# Patient Record
Sex: Female | Born: 1975 | Race: Black or African American | Hispanic: No | Marital: Single | Smoking: Never smoker
Health system: Southern US, Community
[De-identification: ages and names within clinical notes are randomized; demographics above are authoritative.]

## PROBLEM LIST (undated history)

## (undated) DIAGNOSIS — I10 Essential (primary) hypertension: Secondary | ICD-10-CM

## (undated) DIAGNOSIS — I480 Paroxysmal atrial fibrillation: Secondary | ICD-10-CM

## (undated) HISTORY — DX: Morbid (severe) obesity due to excess calories: E66.01

## (undated) HISTORY — PX: TUBAL LIGATION: SHX77

## (undated) HISTORY — DX: Paroxysmal atrial fibrillation: I48.0

## (undated) HISTORY — PX: CHOLECYSTECTOMY: SHX55

## (undated) HISTORY — PX: GASTRIC BYPASS: SHX52

---

## 2004-10-01 ENCOUNTER — Ambulatory Visit: Payer: Self-pay | Admitting: Obstetrics and Gynecology

## 2013-10-01 ENCOUNTER — Ambulatory Visit: Payer: Self-pay | Admitting: Bariatrics

## 2013-10-01 LAB — COMPREHENSIVE METABOLIC PANEL
Alkaline Phosphatase: 129 U/L (ref 50–136)
Anion Gap: 6 — ABNORMAL LOW (ref 7–16)
Calcium, Total: 8.9 mg/dL (ref 8.5–10.1)
EGFR (African American): >60
EGFR (Non-African Amer.): >60
Glucose: 96 mg/dL (ref 65–99)
Osmolality: 275 (ref 275–301)
SGOT(AST): 21 U/L (ref 15–37)
Sodium: 138 mmol/L (ref 136–145)
Total Protein: 7.8 g/dL (ref 6.4–8.2)

## 2013-10-01 LAB — APTT: Activated PTT: 30.9 secs (ref 23.6–35.9)

## 2013-10-01 LAB — PROTIME-INR
INR: 1
Prothrombin Time: 13.6 secs (ref 11.5–14.7)

## 2013-10-01 LAB — CBC WITH DIFFERENTIAL/PLATELET
Eosinophil #: 0 10*3/uL (ref 0.0–0.7)
Eosinophil %: 0.5 %
HGB: 12.6 g/dL (ref 12.0–16.0)
Lymphocyte %: 32.1 %
MCH: 27.6 pg (ref 26.0–34.0)
MCV: 82 fL (ref 80–100)
Monocyte #: 0.5 x10 3/mm (ref 0.2–0.9)
Monocyte %: 4.9 %
Neutrophil #: 5.7 10*3/uL (ref 1.4–6.5)
Neutrophil %: 61.3 %
RBC: 4.58 10*6/uL (ref 3.80–5.20)
WBC: 9.3 10*3/uL (ref 3.6–11.0)

## 2013-10-01 LAB — IRON AND TIBC
Iron Saturation: 20 %
Iron: 66 ug/dL (ref 50–170)

## 2013-10-01 LAB — MAGNESIUM: Magnesium: 1.5 mg/dL — ABNORMAL LOW

## 2013-10-01 LAB — HEMOGLOBIN A1C: Hemoglobin A1C: 6.3 % (ref 4.2–6.3)

## 2013-10-01 LAB — PHOSPHORUS: Phosphorus: 2.7 mg/dL (ref 2.5–4.9)

## 2013-10-01 LAB — BILIRUBIN, DIRECT: Bilirubin, Direct: 0.1 mg/dL (ref 0.00–0.20)

## 2013-10-01 LAB — FOLATE: Folic Acid: 17.6 ng/mL (ref 3.1–100.0)

## 2013-10-01 LAB — FERRITIN: Ferritin (ARMC): 28 ng/mL (ref 8–388)

## 2013-10-01 LAB — AMYLASE: Amylase: 79 U/L (ref 25–115)

## 2013-11-03 ENCOUNTER — Ambulatory Visit: Payer: Self-pay | Admitting: Bariatrics

## 2013-11-08 ENCOUNTER — Ambulatory Visit: Payer: Self-pay | Admitting: Bariatrics

## 2014-01-31 ENCOUNTER — Other Ambulatory Visit: Payer: Self-pay | Admitting: Bariatrics

## 2014-01-31 LAB — COMPREHENSIVE METABOLIC PANEL
ALBUMIN: 4 g/dL (ref 3.4–5.0)
ANION GAP: 10 (ref 7–16)
AST: 21 U/L (ref 15–37)
Alkaline Phosphatase: 84 U/L
BUN: 15 mg/dL (ref 7–18)
Bilirubin,Total: 0.5 mg/dL (ref 0.2–1.0)
CO2: 20 mmol/L — AB (ref 21–32)
Calcium, Total: 9.3 mg/dL (ref 8.5–10.1)
Chloride: 106 mmol/L (ref 98–107)
Creatinine: 0.78 mg/dL (ref 0.60–1.30)
EGFR (African American): >60
EGFR (Non-African Amer.): >60
Glucose: 74 mg/dL (ref 65–99)
Osmolality: 271 (ref 275–301)
Potassium: 3.5 mmol/L (ref 3.5–5.1)
SGPT (ALT): 28 U/L (ref 12–78)
SODIUM: 136 mmol/L (ref 136–145)
TOTAL PROTEIN: 8.1 g/dL (ref 6.4–8.2)

## 2015-08-09 ENCOUNTER — Emergency Department
Admission: EM | Admit: 2015-08-09 | Discharge: 2015-08-09 | Disposition: A | Discharge status: Other Acute Inpatient Hospital | Payer: BLUE CROSS/BLUE SHIELD | Attending: Emergency Medicine | Admitting: Emergency Medicine

## 2015-08-09 ENCOUNTER — Emergency Department: Payer: BLUE CROSS/BLUE SHIELD

## 2015-08-09 ENCOUNTER — Encounter: Payer: Self-pay | Admitting: Emergency Medicine

## 2015-08-09 DIAGNOSIS — Z3202 Encounter for pregnancy test, result negative: Secondary | ICD-10-CM | POA: Diagnosis not present

## 2015-08-09 DIAGNOSIS — Z79899 Other long term (current) drug therapy: Secondary | ICD-10-CM | POA: Diagnosis not present

## 2015-08-09 DIAGNOSIS — R109 Unspecified abdominal pain: Secondary | ICD-10-CM

## 2015-08-09 DIAGNOSIS — Z9049 Acquired absence of other specified parts of digestive tract: Secondary | ICD-10-CM | POA: Insufficient documentation

## 2015-08-09 LAB — HEPATIC FUNCTION PANEL
ALT: 15 U/L (ref 14–54)
AST: 26 U/L (ref 15–41)
Albumin: 4.2 g/dL (ref 3.5–5.0)
Alkaline Phosphatase: 83 U/L (ref 38–126)
Bilirubin, Direct: <0.1 mg/dL — ABNORMAL LOW (ref 0.1–0.5)
Total Bilirubin: 0.6 mg/dL (ref 0.3–1.2)
Total Protein: 7.6 g/dL (ref 6.5–8.1)

## 2015-08-09 LAB — URINALYSIS COMPLETE WITH MICROSCOPIC (ARMC ONLY)
BILIRUBIN URINE: NEGATIVE
Glucose, UA: NEGATIVE mg/dL
NITRITE: NEGATIVE
Protein, ur: 30 mg/dL — AB
SPECIFIC GRAVITY, URINE: 1.023 (ref 1.005–1.030)
pH: 6 (ref 5.0–8.0)

## 2015-08-09 LAB — BASIC METABOLIC PANEL
Anion gap: 9 (ref 5–15)
BUN: 11 mg/dL (ref 6–20)
CO2: 24 mmol/L (ref 22–32)
Calcium: 9.2 mg/dL (ref 8.9–10.3)
Chloride: 103 mmol/L (ref 101–111)
Creatinine, Ser: 0.65 mg/dL (ref 0.44–1.00)
GFR calc Af Amer: >60 mL/min (ref >60)
GFR calc non Af Amer: >60 mL/min (ref >60)
Glucose, Bld: 148 mg/dL — ABNORMAL HIGH (ref 65–99)
Potassium: 3.7 mmol/L (ref 3.5–5.1)
Sodium: 136 mmol/L (ref 135–145)

## 2015-08-09 LAB — CBC
HCT: 36 % (ref 35.0–47.0)
HEMOGLOBIN: 11.9 g/dL — AB (ref 12.0–16.0)
MCH: 28.4 pg (ref 26.0–34.0)
MCHC: 33.1 g/dL (ref 32.0–36.0)
MCV: 85.8 fL (ref 80.0–100.0)
PLATELETS: 258 10*3/uL (ref 150–440)
RBC: 4.19 MIL/uL (ref 3.80–5.20)
RDW: 12.1 % (ref 11.5–14.5)
WBC: 12.8 10*3/uL — ABNORMAL HIGH (ref 3.6–11.0)

## 2015-08-09 LAB — PREGNANCY, URINE: Preg Test, Ur: NEGATIVE

## 2015-08-09 MED ORDER — HYDROMORPHONE HCL 1 MG/ML IJ SOLN: 0.5000 mg | INTRAMUSCULAR | Status: AC

## 2015-08-09 MED ORDER — HYDROMORPHONE HCL 1 MG/ML IJ SOLN
0.5000 mg | INTRAMUSCULAR | Status: AC
  Administered 2015-08-09: 0.5 mg via INTRAVENOUS
  Filled 2015-08-09: qty 1

## 2015-08-09 MED ORDER — HYDROMORPHONE HCL 1 MG/ML IJ SOLN
1.0000 mg | INTRAMUSCULAR | Status: AC
  Administered 2015-08-09: 1 mg via INTRAVENOUS
  Filled 2015-08-09: qty 1

## 2015-08-09 MED ORDER — ONDANSETRON HCL 4 MG/2ML IJ SOLN
4.0000 mg | Freq: Once | INTRAMUSCULAR | Status: AC
  Administered 2015-08-09: 4 mg via INTRAVENOUS

## 2015-08-09 MED ORDER — HYDROMORPHONE HCL 1 MG/ML IJ SOLN
INTRAMUSCULAR | Status: AC
  Administered 2015-08-09: 1 mg via INTRAVENOUS
  Filled 2015-08-09: qty 1

## 2015-08-09 MED ORDER — HYDROMORPHONE HCL 1 MG/ML IJ SOLN
INTRAMUSCULAR | Status: AC
  Filled 2015-08-09: qty 1

## 2015-08-09 MED ORDER — SODIUM CHLORIDE 0.9 % IV BOLUS (SEPSIS)
1000.0000 mL | Freq: Once | INTRAVENOUS | Status: AC
  Administered 2015-08-09: 1000 mL via INTRAVENOUS

## 2015-08-09 MED ORDER — IOHEXOL 240 MG/ML SOLN
25.0000 mL | Freq: Once | INTRAMUSCULAR | Status: DC | PRN
  Administered 2015-08-09: 25 mL via ORAL
  Filled 2015-08-09: qty 50

## 2015-08-09 MED ORDER — IOHEXOL 350 MG/ML SOLN
100.0000 mL | Freq: Once | INTRAVENOUS | Status: AC | PRN
  Administered 2015-08-09: 100 mL via INTRAVENOUS

## 2015-08-09 MED ORDER — MORPHINE SULFATE (PF) 4 MG/ML IV SOLN
INTRAVENOUS | Status: AC
  Administered 2015-08-09: 4 mg via INTRAVENOUS
  Filled 2015-08-09: qty 1

## 2015-08-09 MED ORDER — MORPHINE SULFATE (PF) 4 MG/ML IV SOLN
4.0000 mg | Freq: Once | INTRAVENOUS | Status: AC
  Administered 2015-08-09: 4 mg via INTRAVENOUS

## 2015-08-09 MED ORDER — HYDROMORPHONE HCL 1 MG/ML IJ SOLN
0.5000 mg | Freq: Once | INTRAMUSCULAR | Status: AC
  Administered 2015-08-09: 0.5 mg via INTRAVENOUS

## 2015-08-09 MED ORDER — HYDROMORPHONE HCL 1 MG/ML IJ SOLN
1.0000 mg | Freq: Once | INTRAMUSCULAR | Status: AC
  Administered 2015-08-09: 1 mg via INTRAVENOUS

## 2015-08-09 MED ORDER — METOCLOPRAMIDE HCL 5 MG/ML IJ SOLN
10.0000 mg | Freq: Once | INTRAMUSCULAR | Status: AC
  Administered 2015-08-09: 10 mg via INTRAVENOUS
  Filled 2015-08-09: qty 2

## 2015-08-09 MED ORDER — ONDANSETRON HCL 4 MG/2ML IJ SOLN
INTRAMUSCULAR | Status: AC
  Administered 2015-08-09: 4 mg via INTRAVENOUS
  Filled 2015-08-09: qty 2

## 2015-08-09 NOTE — ED Notes (Signed)
Urine pregnancy test POCT done and resulted negative.

## 2015-08-09 NOTE — ED Notes (Signed)
Pt to triage via w/c, appears uncomfortable; pt reports since yesterday having left flank pain radiating around into abdomen accomp by N/V; denies hx of same

## 2015-08-09 NOTE — ED Notes (Signed)
Patient failed to get any relief from administered Morphine . Patient writhing in bed and reporting intense pain. MD made aware. VORB for Dilaudid  IVP. Orders to be entered and carried by this RN.

## 2015-08-09 NOTE — ED Provider Notes (Addendum)
[facility] Emergency Department Provider Note  ____________________________________________  Time seen: Approximately 7:18 AM  I have reviewed the triage vital signs and the nursing notes.   HISTORY  Chief Complaint Flank Pain    HPI Cindy Payne is a 39 y.o. female history of previous gastric bypass, also tubal ligation, patient presents today with 1 day of left flank pain that radiates across into her left mid and lower abdomen. She describes as a sharp feeling, associated with nausea and vomiting several times yesterday. No fevers or chills. She did notice her urine seems slightly different yesterday but can't describe why. Denies any burning with urination. No chest pain or trouble breathing.  Pertinent history includes previous gastric bypass approximately 2 years ago for which patient states she's had no complications.   History reviewed. No pertinent past medical history.  There are no active problems to display for this patient.   Past Surgical History  Procedure Laterality Date  . Tubal ligation    . Cholecystectomy    . Gastric bypass      Current Outpatient Rx  Name  Route  Sig  Dispense  Refill  . Multiple Vitamin (MULTIVITAMIN WITH MINERALS) TABS tablet   Oral   Take 1 tablet by mouth daily.           Allergies Septra  No family history on file.  Social History Social History  Substance Use Topics  . Smoking status: Never Smoker   . Smokeless tobacco: None  . Alcohol Use: No    Review of Systems Constitutional: No fever/chills Eyes: No visual changes. ENT: No sore throat. Cardiovascular: Denies chest pain. Respiratory: Denies shortness of breath. Gastrointestinal: See history of present illness No diarrhea.  No constipation. Genitourinary: Negative for dysuria. Musculoskeletal: Negative for back pain. Skin: Negative for rash. Neurological: Negative for headaches, focal weakness or numbness.  10-point ROS  otherwise negative.  ____________________________________________   PHYSICAL EXAM:  VITAL SIGNS: ED Triage Vitals  Enc Vitals Group     BP 08/09/15 0614 160/81 mmHg     Pulse Rate 08/09/15 0614 61     Resp 08/09/15 0614 20     Temp 08/09/15 0614 98.1 F (36.7 C)     Temp Source 08/09/15 0614 Oral     SpO2 08/09/15 0614 98 %     Weight 08/09/15 0614 195 lb (88.451 kg)     Height 08/09/15 0614 5\' 3"  (1.6 m)     Head Cir --      Peak Flow --      Pain Score 08/09/15 0615 10     Pain Loc --      Pain Edu? --      Excl. in GC? --     Constitutional: Alert and oriented. Well appearing and in no acute distress. Eyes: Conjunctivae are normal. PERRL. EOMI. Head: Atraumatic. Nose: No congestion/rhinnorhea. Mouth/Throat: Mucous membranes are moist.  Oropharynx non-erythematous. Neck: No stridor.   Cardiovascular: Normal rate, regular rhythm. Grossly normal heart sounds.  Good peripheral circulation. Respiratory: Normal respiratory effort.  No retractions. Lungs CTAB. Gastrointestinal: Soft and nontender except for moderate tenderness in the left flank without rebound or guarding. No distention. No abdominal bruits. Mild CVA tenderness on left, none on the right. Musculoskeletal: No lower extremity tenderness nor edema.  No joint effusions. Neurologic:  Normal speech and language. No gross focal neurologic deficits are appreciated. No gait instability. Skin:  Skin is warm, dry and intact. No rash noted. Psychiatric: Mood  and affect are normal. Speech and behavior are normal.  ____________________________________________   LABS (all labs ordered are listed, but only abnormal results are displayed)  Labs Reviewed  BASIC METABOLIC PANEL - Abnormal; Notable for the following:    Glucose, Bld 148 (*)    All other components within normal limits  CBC - Abnormal; Notable for the following:    WBC 12.8 (*)    Hemoglobin 11.9 (*)    All other components within normal limits   URINALYSIS COMPLETEWITH MICROSCOPIC ([facility] ONLY) - Abnormal; Notable for the following:    Color, Urine YELLOW (*)    APPearance HAZY (*)    Ketones, ur 1+ (*)    Hgb urine dipstick 1+ (*)    Protein, ur 30 (*)    Leukocytes, UA TRACE (*)    Bacteria, UA MANY (*)    Squamous Epithelial / LPF 0-5 (*)    All other components within normal limits  HEPATIC FUNCTION PANEL - Abnormal; Notable for the following:    Bilirubin, Direct <0.1 (*)    All other components within normal limits  PREGNANCY, URINE   ____________________________________________  EKG   ____________________________________________  RADIOLOGY   IMPRESSION: 1. 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 The stomach and small bowel are in the left lower chest and part of the stomach is dilated and fluid-filled. Oral contrast is getting from the stomach into the jejunum through the gastric bypass. The remainder the stomach is dilated and fluid-filled. I do not see any findings for ischemia but some type of volvulus is possible. 2. Mildly dilated fluid-filled duodenum. 3. Dilated esophagus possibly due to achalasia. ____________________________________________   PROCEDURES  Procedure(s) performed: None  Critical Care performed: No  ____________________________________________   INITIAL IMPRESSION / ASSESSMENT AND PLAN / ED COURSE  Pertinent labs & imaging results that were available during my care of the patient were reviewed by me and considered in my medical decision making (see chart for details).  Patient just 1 day of left flank pain. No systemic symptoms. He does have focal tenderness in the left flank and along the left CVA. Differential diagnosis would include kidney stones, urinary tract infection, nephrolithiasis, ovarian torsion or cyst, alternative considerations are less likely based on exam include gastric perforation, splenic infarction, or other acute  intra-abdominal etiology. No cardiopulmonary symptoms.  ----------------------------------------- 1:00 PM on 08/09/2015 -----------------------------------------  CT scan discussed with Dr. Alva Garnet at [facility]. He advises transfer, and is working to arrange. Dr. Effie Shy accepting physician to [facility] in [place]. Discussed CT findings and concerns with patient, she is agreeable and understanding of need for transfer to [facility] to see Dr. Alva Garnet.  EMTALA  Completed  ----------------------------------------- 3:35 PM on 08/09/2015 -----------------------------------------  Ready and available bed affirmed at [facility] in [place]. Reevaluated the patient's she still has ongoing pain, have ordered additional to light. She is awake and alert in no distress. We'll transfer to [facility] via ALS ambulance. ____________________________________________   FINAL CLINICAL IMPRESSION(S) / ED DIAGNOSES  Final diagnoses:  Abdominal pain, left lateral      Sharyn Creamer, MD 08/09/15 1535  Ongoing care and disposition assigned Dr. Lenard Lance as we await transfer to [facility].  Sharyn Creamer, MD 08/09/15 [phone]

## 2015-08-09 NOTE — ED Notes (Signed)
Patient to [facility] hospital via EMS. Given medication for pain upon departure. RN notified at [facility].

## 2015-08-10 LAB — POCT PREGNANCY, URINE: PREG TEST UR: NEGATIVE

## 2017-01-25 ENCOUNTER — Encounter: Payer: Self-pay | Admitting: *Deleted

## 2017-01-25 ENCOUNTER — Observation Stay
Admission: EM | Admit: 2017-01-25 | Discharge: 2017-01-26 | Disposition: A | Discharge status: Home / Self Care | Payer: BLUE CROSS/BLUE SHIELD | Attending: Internal Medicine | Admitting: Internal Medicine

## 2017-01-25 DIAGNOSIS — I4891 Unspecified atrial fibrillation: Secondary | ICD-10-CM

## 2017-01-25 DIAGNOSIS — Z9049 Acquired absence of other specified parts of digestive tract: Secondary | ICD-10-CM | POA: Diagnosis not present

## 2017-01-25 DIAGNOSIS — I10 Essential (primary) hypertension: Secondary | ICD-10-CM | POA: Insufficient documentation

## 2017-01-25 DIAGNOSIS — Z883 Allergy status to other anti-infective agents status: Secondary | ICD-10-CM | POA: Insufficient documentation

## 2017-01-25 DIAGNOSIS — I480 Paroxysmal atrial fibrillation: Principal | ICD-10-CM | POA: Insufficient documentation

## 2017-01-25 DIAGNOSIS — Z8249 Family history of ischemic heart disease and other diseases of the circulatory system: Secondary | ICD-10-CM | POA: Diagnosis not present

## 2017-01-25 DIAGNOSIS — Z9884 Bariatric surgery status: Secondary | ICD-10-CM | POA: Diagnosis not present

## 2017-01-25 DIAGNOSIS — Z9851 Tubal ligation status: Secondary | ICD-10-CM | POA: Diagnosis not present

## 2017-01-25 DIAGNOSIS — Z79899 Other long term (current) drug therapy: Secondary | ICD-10-CM | POA: Diagnosis not present

## 2017-01-25 DIAGNOSIS — R55 Syncope and collapse: Secondary | ICD-10-CM | POA: Diagnosis present

## 2017-01-25 HISTORY — DX: Essential (primary) hypertension: I10

## 2017-01-25 NOTE — ED Triage Notes (Signed)
Pt at a restaurant w/ witnessed syncopal episode. No significant prior hx of syncope. Pt denies CP and SOB at this time. Pt ws eating and had approximately 1 oz of liquor with dinner. Pt has hx of roux en y. Pt has hx of HTN. Pt presents in a-fib, new onset, and hypotension. Pt is A&O x 4.

## 2017-01-26 ENCOUNTER — Encounter: Payer: Self-pay | Admitting: Family Medicine

## 2017-01-26 ENCOUNTER — Observation Stay (HOSPITAL_BASED_OUTPATIENT_CLINIC_OR_DEPARTMENT_OTHER)
Admit: 2017-01-26 | Discharge: 2017-01-26 | Disposition: A | Discharge status: Home / Self Care | Payer: BLUE CROSS/BLUE SHIELD | Attending: Family Medicine | Admitting: Family Medicine

## 2017-01-26 ENCOUNTER — Inpatient Hospital Stay: Payer: BLUE CROSS/BLUE SHIELD

## 2017-01-26 ENCOUNTER — Emergency Department: Payer: BLUE CROSS/BLUE SHIELD

## 2017-01-26 DIAGNOSIS — I4891 Unspecified atrial fibrillation: Secondary | ICD-10-CM

## 2017-01-26 DIAGNOSIS — R55 Syncope and collapse: Secondary | ICD-10-CM | POA: Diagnosis not present

## 2017-01-26 DIAGNOSIS — I480 Paroxysmal atrial fibrillation: Secondary | ICD-10-CM | POA: Diagnosis not present

## 2017-01-26 DIAGNOSIS — I10 Essential (primary) hypertension: Secondary | ICD-10-CM

## 2017-01-26 LAB — URINALYSIS, ROUTINE W REFLEX MICROSCOPIC
Bilirubin Urine: NEGATIVE
GLUCOSE, UA: NEGATIVE mg/dL
Hgb urine dipstick: NEGATIVE
Ketones, ur: NEGATIVE mg/dL
LEUKOCYTES UA: NEGATIVE
Nitrite: NEGATIVE
PH: 7 (ref 5.0–8.0)
Protein, ur: NEGATIVE mg/dL
Specific Gravity, Urine: 1.003 — ABNORMAL LOW (ref 1.005–1.030)

## 2017-01-26 LAB — CBC
HEMATOCRIT: 39.9 % (ref 35.0–47.0)
HEMATOCRIT: 36.1 % (ref 35.0–47.0)
HEMOGLOBIN: 14 g/dL (ref 12.0–16.0)
Hemoglobin: 12 g/dL (ref 12.0–16.0)
MCH: 29.8 pg (ref 26.0–34.0)
MCH: 28.7 pg (ref 26.0–34.0)
MCHC: 35.1 g/dL (ref 32.0–36.0)
MCHC: 33.2 g/dL (ref 32.0–36.0)
MCV: 85.1 fL (ref 80.0–100.0)
MCV: 86.7 fL (ref 80.0–100.0)
PLATELETS: 260 10*3/uL (ref 150–440)
Platelets: 252 10*3/uL (ref 150–440)
RBC: 4.69 MIL/uL (ref 3.80–5.20)
RBC: 4.17 MIL/uL (ref 3.80–5.20)
RDW: 12.5 % (ref 11.5–14.5)
RDW: 12.6 % (ref 11.5–14.5)
WBC: 8 10*3/uL (ref 3.6–11.0)
WBC: 6.8 10*3/uL (ref 3.6–11.0)

## 2017-01-26 LAB — BASIC METABOLIC PANEL
ANION GAP: 6 (ref 5–15)
Anion gap: 8 (ref 5–15)
BUN: 7 mg/dL (ref 6–20)
BUN: 7 mg/dL (ref 6–20)
CALCIUM: 8.3 mg/dL — AB (ref 8.9–10.3)
CO2: 18 mmol/L — ABNORMAL LOW (ref 22–32)
CO2: 24 mmol/L (ref 22–32)
CREATININE: 0.8 mg/dL (ref 0.44–1.00)
Calcium: 8.4 mg/dL — ABNORMAL LOW (ref 8.9–10.3)
Chloride: 113 mmol/L — ABNORMAL HIGH (ref 101–111)
Chloride: 115 mmol/L — ABNORMAL HIGH (ref 101–111)
Creatinine, Ser: 0.71 mg/dL (ref 0.44–1.00)
GFR calc Af Amer: >60 mL/min (ref >60)
GFR calc Af Amer: >60 mL/min (ref >60)
GLUCOSE: 53 mg/dL — AB (ref 65–99)
GLUCOSE: 98 mg/dL (ref 65–99)
Potassium: 3.7 mmol/L (ref 3.5–5.1)
Potassium: 4 mmol/L (ref 3.5–5.1)
SODIUM: 141 mmol/L (ref 135–145)
Sodium: 143 mmol/L (ref 135–145)

## 2017-01-26 LAB — ECHOCARDIOGRAM COMPLETE
CHL CUP MV DEC (S): 271
E decel time: 271 msec
E/e' ratio: 6.25
FS: 39 % (ref 28–44)
HEIGHTINCHES: 63 in
IV/PV OW: 0.98
LADIAMINDEX: 1.86 cm/m2
LASIZE: 38 mm
LAVOL: 56.4 mL
LAVOLA4C: 50.1 mL
LAVOLIN: 27.7 mL/m2
LEFT ATRIUM END SYS DIAM: 38 mm
LV E/e' medial: 6.25
LV PW d: 10.5 mm — AB (ref 0.6–1.1)
LV TDI E'MEDIAL: 8.16
LVEEAVG: 6.25
LVELAT: 11.4 cm/s
Lateral S' vel: 13.3 cm/s
MV Peak grad: 2 mmHg
MV pk A vel: 57.6 m/s
MV pk E vel: 71.3 m/s
TAPSE: 23.6 mm
TDI e' lateral: 11.4
WEIGHTICAEL: 3168 [oz_av]

## 2017-01-26 LAB — LIPID PANEL
Cholesterol: 82 mg/dL (ref 0–200)
HDL: 39 mg/dL — AB (ref >40)
LDL Cholesterol: 35 mg/dL (ref 0–99)
Total CHOL/HDL Ratio: 2.1 RATIO
Triglycerides: 42 mg/dL (ref <150)
VLDL: 8 mg/dL (ref 0–40)

## 2017-01-26 LAB — HEPATIC FUNCTION PANEL
ALBUMIN: 3.9 g/dL (ref 3.5–5.0)
ALK PHOS: 88 U/L (ref 38–126)
ALT: 17 U/L (ref 14–54)
AST: 34 U/L (ref 15–41)
BILIRUBIN TOTAL: 0.9 mg/dL (ref 0.3–1.2)
Bilirubin, Direct: 0.4 mg/dL (ref 0.1–0.5)
Indirect Bilirubin: 0.5 mg/dL (ref 0.3–0.9)
TOTAL PROTEIN: 7.3 g/dL (ref 6.5–8.1)

## 2017-01-26 LAB — PROTIME-INR
INR: 1.02
INR: 1.13
Prothrombin Time: 13.4 seconds (ref 11.4–15.2)
Prothrombin Time: 14.6 seconds (ref 11.4–15.2)

## 2017-01-26 LAB — TROPONIN I: Troponin I: <0.03 ng/mL (ref <0.03)

## 2017-01-26 LAB — LIPASE, BLOOD: Lipase: 25 U/L (ref 11–51)

## 2017-01-26 LAB — TSH: TSH: 1.473 u[IU]/mL (ref 0.350–4.500)

## 2017-01-26 LAB — MAGNESIUM
Magnesium: 1.9 mg/dL (ref 1.7–2.4)
Magnesium: 2.1 mg/dL (ref 1.7–2.4)

## 2017-01-26 LAB — GLUCOSE, CAPILLARY: GLUCOSE-CAPILLARY: 117 mg/dL — AB (ref 65–99)

## 2017-01-26 MED ORDER — RIVAROXABAN 20 MG PO TABS
20.0000 mg | ORAL_TABLET | Freq: Every day | ORAL | Status: DC
  Administered 2017-01-26: 20 mg via ORAL
  Filled 2017-01-26: qty 1

## 2017-01-26 MED ORDER — DIGOXIN 0.25 MG/ML IJ SOLN
0.2500 mg | Freq: Once | INTRAMUSCULAR | Status: AC
  Administered 2017-01-26: 0.25 mg via INTRAVENOUS

## 2017-01-26 MED ORDER — ONDANSETRON HCL 4 MG/2ML IJ SOLN: 4.0000 mg | Freq: Four times a day (QID) | INTRAMUSCULAR | Status: DC | PRN

## 2017-01-26 MED ORDER — SODIUM CHLORIDE 0.9% FLUSH: 3.0000 mL | INTRAVENOUS | Status: DC | PRN

## 2017-01-26 MED ORDER — ASPIRIN EC 81 MG PO TBEC
81.0000 mg | DELAYED_RELEASE_TABLET | Freq: Every day | ORAL | Status: DC
  Administered 2017-01-26: 81 mg via ORAL
  Filled 2017-01-26: qty 1

## 2017-01-26 MED ORDER — DIGOXIN 0.25 MG/ML IJ SOLN
INTRAMUSCULAR | Status: AC
  Administered 2017-01-26: 0.25 mg via INTRAVENOUS
  Filled 2017-01-26: qty 2

## 2017-01-26 MED ORDER — ADULT MULTIVITAMIN W/MINERALS CH
1.0000 | ORAL_TABLET | Freq: Every day | ORAL | Status: DC
  Administered 2017-01-26: 1 via ORAL
  Filled 2017-01-26: qty 1

## 2017-01-26 MED ORDER — METOPROLOL TARTRATE 5 MG/5ML IV SOLN
5.0000 mg | Freq: Once | INTRAVENOUS | Status: AC
Start: 2017-01-26 — End: 2017-01-26
  Administered 2017-01-26: 5 mg via INTRAVENOUS
  Filled 2017-01-26: qty 5

## 2017-01-26 MED ORDER — RIVAROXABAN 20 MG PO TABS: 20.0000 mg | ORAL_TABLET | Freq: Every day | ORAL | 0 refills | Status: DC

## 2017-01-26 MED ORDER — SODIUM CHLORIDE 0.9 % IV SOLN: 250.0000 mL | INTRAVENOUS | Status: DC | PRN

## 2017-01-26 MED ORDER — HEPARIN SODIUM (PORCINE) 5000 UNIT/ML IJ SOLN
5000.0000 [IU] | Freq: Three times a day (TID) | INTRAMUSCULAR | Status: DC
  Administered 2017-01-26: 5000 [IU] via SUBCUTANEOUS
  Filled 2017-01-26: qty 1

## 2017-01-26 MED ORDER — DIGOXIN 0.25 MG/ML IJ SOLN
2.0000 ug/kg | Freq: Four times a day (QID) | INTRAMUSCULAR | Status: DC
  Administered 2017-01-26: 180 ug via INTRAVENOUS
  Filled 2017-01-26 (×2): qty 1

## 2017-01-26 MED ORDER — ASPIRIN 81 MG PO TBEC: 81.0000 mg | DELAYED_RELEASE_TABLET | Freq: Every day | ORAL | 0 refills | Status: DC

## 2017-01-26 MED ORDER — SODIUM CHLORIDE 0.9 % IV BOLUS (SEPSIS)
500.0000 mL | INTRAVENOUS | Status: AC
  Administered 2017-01-26: 500 mL via INTRAVENOUS

## 2017-01-26 MED ORDER — METOPROLOL TARTRATE 50 MG PO TABS
50.0000 mg | ORAL_TABLET | Freq: Two times a day (BID) | ORAL | Status: DC
  Administered 2017-01-26: 50 mg via ORAL
  Filled 2017-01-26: qty 1

## 2017-01-26 MED ORDER — ZOLPIDEM TARTRATE 5 MG PO TABS: 5.0000 mg | ORAL_TABLET | Freq: Every evening | ORAL | Status: DC | PRN

## 2017-01-26 MED ORDER — SODIUM CHLORIDE 0.9% FLUSH
3.0000 mL | Freq: Two times a day (BID) | INTRAVENOUS | Status: DC
  Administered 2017-01-26: 3 mL via INTRAVENOUS

## 2017-01-26 MED ORDER — DIGOXIN 0.25 MG/ML IJ SOLN: 2.0000 ug/kg | Freq: Four times a day (QID) | INTRAMUSCULAR | Status: DC

## 2017-01-26 MED ORDER — AMLODIPINE BESYLATE 5 MG PO TABS
5.0000 mg | ORAL_TABLET | Freq: Every day | ORAL | Status: DC
  Administered 2017-01-26: 5 mg via ORAL
  Filled 2017-01-26: qty 1

## 2017-01-26 MED ORDER — ASPIRIN 81 MG PO CHEW
324.0000 mg | CHEWABLE_TABLET | Freq: Once | ORAL | Status: AC
  Administered 2017-01-26: 324 mg via ORAL
  Filled 2017-01-26: qty 4

## 2017-01-26 MED ORDER — ACETAMINOPHEN 325 MG PO TABS: 650.0000 mg | ORAL_TABLET | ORAL | Status: DC | PRN

## 2017-01-26 NOTE — Consult Note (Signed)
CARDIOLOGY CONSULT NOTE     Primary Care Physician: No PCP Per Patient Referring Physician: Hugelmeyer  Admit Date: 01/25/2017  Reason for consultation: atrial fibrillation  Cindy Payne is a 41 y.o. female with a h/o Hypertension. She had an episode of loss of consciousness when standing. She says that she had 3 alcoholic beverages which she usually does not do. She did not have any preceding symptoms such as dizziness, lightheadedness, palpitations, chest pain, or shortness of breath. He takes metoprolol and amlodipine for hypertension, but at times misses doses of metoprolol. In the emergency room, she was found to be in atrial fibrillation. Her heart rates range from 1:15 to 150. She was given one dose of IV metoprolol.  Today, she denies symptoms of palpitations, chest pain, shortness of breath, orthopnea, PND, lower extremity edema, dizziness, presyncope, syncope, or neurologic sequela. The patient is tolerating medications without difficulties and is otherwise without complaint today.   Past Medical History:  Diagnosis Date  . Hypertension    Past Surgical History:  Procedure Laterality Date  . CHOLECYSTECTOMY    . GASTRIC BYPASS    . TUBAL LIGATION      . amLODipine  5 mg Oral Daily  . aspirin EC  81 mg Oral Daily  . heparin subcutaneous  5,000 Units Subcutaneous Q8H  . metoprolol tartrate  50 mg Oral BID  . multivitamin with minerals  1 tablet Oral Daily  . sodium chloride flush  3 mL Intravenous Q12H     Allergies  Allergen Reactions  . Septra [Sulfamethoxazole-Trimethoprim] Rash    Social History   Social History  . Marital status: Single    Spouse name: N/A  . Number of children: N/A  . Years of education: N/A   Occupational History  . Not on file.   Social History Main Topics  . Smoking status: Never Smoker  . Smokeless tobacco: Never Used  . Alcohol use Yes  . Drug use: No  . Sexual activity: Not on file   Other Topics Concern  . Not on  file   Social History Narrative  . No narrative on file    Family History  Problem Relation Age of Onset  . Congestive Heart Failure Mother     ROS- All systems are reviewed and negative except as per the HPI above  Physical Exam: Telemetry: Vitals:   01/26/17 0515 01/26/17 0553 01/26/17 0735 01/26/17 1129  BP: 108/75 112/72 114/72 132/68  Pulse: 77 73 71 82  Resp: 15 16 18    Temp:  98.1 F (36.7 C) 98.1 F (36.7 C) 98.1 F (36.7 C)  TempSrc:  Oral Oral Oral  SpO2: 98% 100% 100% 100%  Weight:      Height:        GEN- The patient is well appearing, alert and oriented x 3 today.   Head- normocephalic, atraumatic Eyes-  Sclera clear, conjunctiva pink Ears- hearing intact Oropharynx- clear Neck- supple, no JVP Lymph- no cervical lymphadenopathy Lungs- Clear to ausculation bilaterally, normal work of breathing Heart- Regular rate and rhythm, no murmurs, rubs or gallops, PMI not laterally displaced GI- soft, NT, ND, + BS Extremities- no clubbing, cyanosis, or edema MS- no significant deformity or atrophy Skin- no rash or lesion Psych- euthymic mood, full affect Neuro- strength and sensation are intact  EKG: Sinus rhythm, early RS progression Telemetry: Atrial fibrillation Labs:   Lab Results  Component Value Date   WBC 6.8 01/26/2017   HGB 12.0 01/26/2017   HCT  36.1 01/26/2017   MCV 86.7 01/26/2017   PLT 252 01/26/2017    Recent Labs Lab 01/25/17 2351 01/26/17 0621  NA 141 143  K 3.7 4.0  CL 115* 113*  CO2 18* 24  BUN 7 7  CREATININE 0.80 0.71  CALCIUM 8.4* 8.3*  PROT 7.3  --   BILITOT 0.9  --   ALKPHOS 88  --   ALT 17  --   AST 34  --   GLUCOSE 53* 98   Lab Results  Component Value Date   TROPONINI <0.03 01/26/2017    Lab Results  Component Value Date   CHOL 82 01/26/2017   Lab Results  Component Value Date   HDL 39 (L) 01/26/2017   Lab Results  Component Value Date   LDLCALC 35 01/26/2017   Lab Results  Component Value Date    TRIG 42 01/26/2017   Lab Results  Component Value Date   CHOLHDL 2.1 01/26/2017   No results found for: LDLDIRECT    Radiology: No active cardiopulmonary disease.  Echo: pending  ASSESSMENT AND PLAN:   1. Paroxysmal atrial fibrillation: Found on presentation to the emergency room with rapid heart rates ranging from 115 to 150. She takes metoprolol for blood pressure, and would continue that medication. She is planned for an echocardiogram. Her stroke risk is elevated due to her history of hypertension as well as being a female. She would best benefit from anticoagulation. Would recommend Xarelto as she has trouble remembering to take her medications twice per day. Would fit her with a 30 day monitor, as she did have an episode of syncope. Would also have her follow-up in cardiology clinic.  2. Hypertension: Currently well controlled. Continue current therapy.  3. Syncope: At this time, it is unclear as to the cause of her syncope. It is possible that her episode of atrial fibrillation caused her episode, but without documented pauses from atrial fibrillation converting to sinus rhythm, it is impossible to say. At this time, would fit her with a 30 day monitor to further determine if she has an arrhythmogenic cause of syncope. Of note, without a diagnosis of syncope, she should not drive for the next 6 months as determined by [employer] [place] law.   Dallis Darden Jorja LoaMartin Aneshia Jacquet, MD 01/26/2017  12:14 PM

## 2017-01-26 NOTE — Progress Notes (Signed)
ANTICOAGULATION CONSULT NOTE  Pharmacy Consult for Xarelto Indication: atrial fibrillation  Allergies  Allergen Reactions  . Septra [Sulfamethoxazole-Trimethoprim] Rash    Patient Measurements: Height: 5\' 3"  (160 cm) Weight: 198 lb (89.8 kg) IBW/kg (Calculated) : 52.4   Vital Signs: Temp: 98.1 F (36.7 C) (02/18 1129) Temp Source: Oral (02/18 1129) BP: 132/68 (02/18 1129) Pulse Rate: 82 (02/18 1129)  Labs:  Recent Labs  01/25/17 2351 01/26/17 0126 01/26/17 0621  HGB  --  14.0 12.0  HCT  --  39.9 36.1  PLT  --  260 252  LABPROT  --  13.4 14.6  INR  --  1.02 1.13  CREATININE 0.80  --  0.71  TROPONINI <0.03  --  <0.03    Estimated Creatinine Clearance: 99.5 mL/min (by C-G formula based on SCr of 0.71 mg/dL).   Medical History: Past Medical History:  Diagnosis Date  . Hypertension     Assessment: 41 y/o F with a h/o HTN admitted with new-onset atrial fibrillation.   Plan:  Will order rivaroxaban 20 mg daily with supper.   Cindy Payne, Cindy Payne 01/26/2017,1:05 PM

## 2017-01-26 NOTE — Progress Notes (Signed)
Patient discharged per MD order and hospital protocol. Patient verbalized understanding of discharge instructions, medications and follow up appointments. Patient was instructed not to drive for six months per cardiologist. Patient was given her prescriptions for medications. Patient also was given educational handouts on xarelto and Afib. Patient escorted off the unit via wheelchair with staff.

## 2017-01-26 NOTE — ED Provider Notes (Signed)
[facility] Emergency Department Provider Note  ____________________________________________   First MD Initiated Contact with Patient 01/26/17 0022     (approximate)  I have reviewed the triage vital signs and the nursing notes.   HISTORY  Chief Complaint Loss of Consciousness    HPI Cindy Payne is a 41 y.o. female with a history of hypertension who takes metoprolol and amlodipine.  She presents for evaluation of a syncopal episode at a restaurant.  She states that she had a meal and has been in her normal state of health all day today.  She was at the register paying and the next thing she knew she was on the ground.  There was no prodrome and she did not feel lightheaded, flushed, dizzy, or anything else prior to the episode.  She denies fever/chills, chest pain, shortness of breath, nausea, vomiting, abdominal pain.  She did not have any seizure-like activity.  She was not postictal after the fall.  She did not sustain any injuries and denies headache and neck pain.  She has never had any syncopal episodes in the past and does not have a cardiac history.  She has not had any medication changes.  She reports that she is supposed to be taking metoprolol 50 mg twice daily but she usually forgets the evening dose so her last dose was this morning.  Her medical record indicates that it is metoprolol succinate rather than tartrate.  Her blood pressure was borderline hypotensive upon arrival and her heart rate has been ranging between 115 and 150.   Past Medical History:  Diagnosis Date  . Hypertension     There are no active problems to display for this patient.   Past Surgical History:  Procedure Laterality Date  . CHOLECYSTECTOMY    . GASTRIC BYPASS    . TUBAL LIGATION      Prior to Admission medications   Medication Sig Start Date End Date Taking? Authorizing Provider  amLODipine (NORVASC) 5 MG tablet Take 5 mg by mouth daily.   Yes Historical  Provider, MD  metoprolol succinate (TOPROL-XL) 50 MG 24 hr tablet Take 50 mg by mouth daily. Take with or immediately following a meal.   Yes Historical Provider, MD  Multiple Vitamin (MULTIVITAMIN WITH MINERALS) TABS tablet Take 1 tablet by mouth daily.    Historical Provider, MD    Allergies Septra [sulfamethoxazole-trimethoprim]  History reviewed. No pertinent family history.  Social History Social History  Substance Use Topics  . Smoking status: Never Smoker  . Smokeless tobacco: Never Used  . Alcohol use Yes    Review of Systems Constitutional: No fever/chills Eyes: No visual changes. ENT: No sore throat. Cardiovascular: Denies chest pain. Respiratory: Denies shortness of breath. Gastrointestinal: No abdominal pain.  No nausea, no vomiting.  No diarrhea.  No constipation. Genitourinary: Negative for dysuria. Musculoskeletal: Negative for back pain. Skin: Negative for rash. Neurological: Negative for headaches, focal weakness or numbness.  10-point ROS otherwise negative.  ____________________________________________   PHYSICAL EXAM:  VITAL SIGNS: ED Triage Vitals  Enc Vitals Group     BP 01/25/17 2330 101/80     Pulse Rate 01/25/17 2330 (!) 125     Resp 01/25/17 2341 16     Temp 01/25/17 2341 98.4 F (36.9 C)     Temp Source 01/25/17 2341 Oral     SpO2 01/25/17 2330 100 %     Weight 01/25/17 2342 198 lb (89.8 kg)     Height 01/25/17 2342  5\' 3"  (1.6 m)     Head Circumference --      Peak Flow --      Pain Score --      Pain Loc --      Pain Edu? --      Excl. in GC? --     Constitutional: Alert and oriented. Well appearing and in no acute distress. Eyes: Conjunctivae are normal. PERRL. EOMI. Head: Atraumatic. Nose: No congestion/rhinnorhea. Mouth/Throat: Mucous membranes are moist. Neck: No stridor.  No meningeal signs.   Cardiovascular: Variable tachycardia with irregular rhythm. Good peripheral circulation. Grossly normal heart  sounds. Respiratory: Normal respiratory effort.  No retractions. Lungs CTAB. Gastrointestinal: Soft and nontender. No distention.  Musculoskeletal: No lower extremity tenderness nor edema. No gross deformities of extremities. Neurologic:  Normal speech and language. No gross focal neurologic deficits are appreciated.  Skin:  Skin is warm, dry and intact. No rash noted. Psychiatric: Mood and affect are normal. Speech and behavior are normal.  ____________________________________________   LABS (all labs ordered are listed, but only abnormal results are displayed)  Labs Reviewed  BASIC METABOLIC PANEL - Abnormal; Notable for the following:       Result Value   Chloride 115 (*)    CO2 18 (*)    Glucose, Bld 53 (*)    Calcium 8.4 (*)    All other components within normal limits  URINALYSIS, ROUTINE W REFLEX MICROSCOPIC - Abnormal; Notable for the following:    Color, Urine STRAW (*)    APPearance CLEAR (*)    Specific Gravity, Urine 1.003 (*)    All other components within normal limits  HEPATIC FUNCTION PANEL  LIPASE, BLOOD  MAGNESIUM  TROPONIN I  PROTIME-INR  CBC   ____________________________________________  EKG  ED ECG REPORT I, Christne Platts, the attending physician, personally viewed and interpreted this ECG.  Date: 01/25/2017 EKG Time: 23:33 Rate: 102 Rhythm: Atrial fibrillation with borderline rapid ventricular response QRS Axis: normal Intervals: Abnormal due to atrial fibrillation ST/T Wave abnormalities: Non-specific ST segment / T-wave changes, but no evidence of acute ischemia. Conduction Disturbances: none Narrative Interpretation: unremarkable  ____________________________________________  RADIOLOGY   Dg Chest 2 View  Result Date: 01/26/2017 CLINICAL DATA:  Syncopal episode at a restaurant. EXAM: CHEST  2 VIEW COMPARISON:  10/01/2013 FINDINGS: Segmental elevation of the left hemidiaphragm. Surgical clips in the left upper quadrant. Normal heart  size and pulmonary vascularity. No focal airspace disease or consolidation in the lungs. No blunting of costophrenic angles. No pneumothorax. Mediastinal contours appear intact. IMPRESSION: No active cardiopulmonary disease. Electronically Signed   By: Burman Nieves M.D.   On: 01/26/2017 00:50    ____________________________________________   PROCEDURES  Procedure(s) performed:   Procedures   Critical Care performed: No ____________________________________________   INITIAL IMPRESSION / ASSESSMENT AND PLAN / ED COURSE  Pertinent labs & imaging results that were available during my care of the patient were reviewed by me and considered in my medical decision making (see chart for details).  I am concerned about cardiogenic syncope, and it was likely the moment where the patient went from sinus rhythm to atrial fibrillation.  She has rapid ventricular response with a rate between 115 and 150.  I will give her dose of metoprolol 5 mg IV to help settle down the  Evaluated and possibly the rhythm, but I explained to her that it I think she is high risk syncope given the cardiogenic nature, the new onset atrial fibrillation, and deserves at  least observation of the hospital with cardiology consultation.  She agrees to plan.  I will defer anticoagulation to the hospitalist.  I spoke by phone with the hospitalist who will admit.     ____________________________________________  FINAL CLINICAL IMPRESSION(S) / ED DIAGNOSES  Final diagnoses:  Atrial fibrillation with RVR (HCC)  Syncope, cardiogenic     MEDICATIONS GIVEN DURING THIS VISIT:  Medications  sodium chloride 0.9 % bolus 500 mL (not administered)  metoprolol (LOPRESSOR) injection 5 mg (not administered)  aspirin chewable tablet 324 mg (not administered)     NEW OUTPATIENT MEDICATIONS STARTED DURING THIS VISIT:  New Prescriptions   No medications on file    Modified Medications   No medications on file     Discontinued Medications   No medications on file     Note:  This document was prepared using Dragon voice recognition software and may include unintentional dictation errors.    Loleta Roseory Geneva Barrero, MD 01/26/17 [phone]

## 2017-01-26 NOTE — Discharge Summary (Signed)
[facility]   PATIENT NAME: Cindy Payne    MR#:  [id number]  DATE OF BIRTH:  [birth year]  DATE OF ADMISSION:  01/25/2017  ADMITTING PHYSICIAN: Tonye Royalty, DO  DATE OF DISCHARGE: 01/26/2017  PRIMARY CARE PHYSICIAN: No PCP Per Patient    ADMISSION DIAGNOSIS:  Syncope [R55] Syncope, cardiogenic [R55] Atrial fibrillation with RVR (HCC) [I48.91]  DISCHARGE DIAGNOSIS:  Active Problems:   New onset atrial fibrillation (HCC)   A-fib (HCC)   Atrial fibrillation (HCC)   SECONDARY DIAGNOSIS:   Past Medical History:  Diagnosis Date  . Hypertension     HOSPITAL COURSE:  41 year female with HTN who presented with new onset Atrial Fibrillation.  1. PAF: Patient converted to NSR. She was evaluated by Cardiology. Due to underlying HTN and being female she has an increased risk of CVA. Recommendations were to start Anticoagulation. I reviewed side effects, alternatives and benefits with her. She accepts these and will start on Xarelto since it is once a day dosing. She will continue Metoprolol for heart rate control and will have outpatient Cardiology follow up. She will need a 30 day monitor which will be set up at her outpatient cardiology visit.   2. Essential HTN: She will continue outpatient regimen.  3. Syncope if unclear etiology. She had no pauses on telemetry and has ruled for for MI. She needs 30 day monitor and was advised not to drive for the next 6 months as determined by [place] law, as per Cardiology. DISCHARGE CONDITIONS AND DIET:   Stable hear healthy diet  CONSULTS OBTAINED:  Treatment Team:  Will Jorja Loa, MD  DRUG ALLERGIES:   Allergies  Allergen Reactions  . Septra [Sulfamethoxazole-Trimethoprim] Rash    DISCHARGE MEDICATIONS:   Current Discharge Medication List    START taking these medications   Details  aspirin EC 81 MG EC tablet Take 1 tablet (81 mg total) by mouth daily. Qty: 120 tablet,  Refills: 0      CONTINUE these medications which have NOT CHANGED   Details  amLODipine (NORVASC) 5 MG tablet Take 5 mg by mouth daily.    metoprolol succinate (TOPROL-XL) 50 MG 24 hr tablet Take 50 mg by mouth daily. Take with or immediately following a meal.      STOP taking these medications     Multiple Vitamin (MULTIVITAMIN WITH MINERALS) TABS tablet           Today   CHIEF COMPLAINT:  No chest pain or sob   VITAL SIGNS:  Blood pressure 114/72, pulse 71, temperature 98.1 F (36.7 C), temperature source Oral, resp. rate 18, height 5\' 3"  (1.6 m), weight 89.8 kg (198 lb), last menstrual period 12/29/2016, SpO2 100 %.   REVIEW OF SYSTEMS:  Review of Systems  Constitutional: Negative.  Negative for chills, fever and malaise/fatigue.  HENT: Negative.  Negative for ear discharge, ear pain, hearing loss, nosebleeds and sore throat.   Eyes: Negative.  Negative for blurred vision and pain.  Respiratory: Negative.  Negative for cough, hemoptysis, shortness of breath and wheezing.   Cardiovascular: Negative.  Negative for chest pain, palpitations and leg swelling.  Gastrointestinal: Negative.  Negative for abdominal pain, blood in stool, diarrhea, nausea and vomiting.  Genitourinary: Negative.  Negative for dysuria.  Musculoskeletal: Negative.  Negative for back pain.  Skin: Negative.   Neurological: Negative for dizziness, tremors, speech change, focal weakness, seizures and headaches.  Endo/Heme/Allergies: Negative.  Does not bruise/bleed easily.  Psychiatric/Behavioral:  Negative.  Negative for depression, hallucinations and suicidal ideas.     PHYSICAL EXAMINATION:  GENERAL:  41 y.o.-year-old patient lying in the bed with no acute distress.  NECK:  Supple, no jugular venous distention. No thyroid enlargement, no tenderness.  LUNGS: Normal breath sounds bilaterally, no wheezing, rales,rhonchi  No use of accessory muscles of respiration.  CARDIOVASCULAR: S1, S2 normal.  No murmurs, rubs, or gallops.  ABDOMEN: Soft, non-tender, non-distended. Bowel sounds present. No organomegaly or mass.  EXTREMITIES: No pedal edema, cyanosis, or clubbing.  PSYCHIATRIC: The patient is alert and oriented x 3.  SKIN: No obvious rash, lesion, or ulcer.   DATA REVIEW:   CBC  Recent Labs Lab 01/26/17 0621  WBC 6.8  HGB 12.0  HCT 36.1  PLT 252    Chemistries   Recent Labs Lab 01/25/17 2351 01/26/17 0621  NA 141 143  K 3.7 4.0  CL 115* 113*  CO2 18* 24  GLUCOSE 53* 98  BUN 7 7  CREATININE 0.80 0.71  CALCIUM 8.4* 8.3*  MG 2.1 1.9  AST 34  --   ALT 17  --   ALKPHOS 88  --   BILITOT 0.9  --     Cardiac Enzymes  Recent Labs Lab 01/25/17 2351 01/26/17 0621  TROPONINI <0.03 <0.03    Microbiology Results  @[email or web address] @  RADIOLOGY:  Dg Chest 2 View  Result Date: 01/26/2017 CLINICAL DATA:  Syncopal episode at a restaurant. EXAM: CHEST  2 VIEW COMPARISON:  10/01/2013 FINDINGS: Segmental elevation of the left hemidiaphragm. Surgical clips in the left upper quadrant. Normal heart size and pulmonary vascularity. No focal airspace disease or consolidation in the lungs. No blunting of costophrenic angles. No pneumothorax. Mediastinal contours appear intact. IMPRESSION: No active cardiopulmonary disease. Electronically Signed   By: Burman Nieves M.D.   On: 01/26/2017 00:50   US Carotid Bilateral  Result Date: 01/26/2017 CLINICAL DATA:  Syncope EXAM: BILATERAL CAROTID DUPLEX ULTRASOUND TECHNIQUE: Wallace Cullens scale imaging, color Doppler and duplex ultrasound were performed of bilateral carotid and vertebral arteries in the neck. COMPARISON:  None. FINDINGS: Criteria: Quantification of carotid stenosis is based on velocity parameters that correlate the residual internal carotid diameter with NASCET-based stenosis levels, using the diameter of the distal internal carotid lumen as the denominator for stenosis measurement. The following velocity measurements were  obtained: RIGHT ICA:  72 cm/sec CCA:  98 cm/sec SYSTOLIC ICA/CCA RATIO:  0.7 DIASTOLIC ICA/CCA RATIO:  1.2 ECA:  85 cm/sec LEFT ICA:  82 cm/sec CCA:  111 cm/sec SYSTOLIC ICA/CCA RATIO:  0.7 DIASTOLIC ICA/CCA RATIO:  1.1 ECA:  76 cm/sec RIGHT CAROTID ARTERY: Little if any plaque in the bulb. Low resistance internal carotid Doppler pattern. RIGHT VERTEBRAL ARTERY:  Antegrade. LEFT CAROTID ARTERY: Little if any plaque in the bulb. Low resistance internal carotid Doppler pattern. LEFT VERTEBRAL ARTERY:  Antegrade. IMPRESSION: Less than 50% stenosis in the right and left internal carotid arteries. Electronically Signed   By: Jolaine Click M.D.   On: 01/26/2017 08:39      Current Discharge Medication List    START taking these medications   Details  aspirin EC 81 MG EC tablet Take 1 tablet (81 mg total) by mouth daily. Qty: 120 tablet, Refills: 0      CONTINUE these medications which have NOT CHANGED   Details  amLODipine (NORVASC) 5 MG tablet Take 5 mg by mouth daily.    metoprolol succinate (TOPROL-XL) 50 MG 24 hr tablet Take 50 mg by mouth daily.  Take with or immediately following a meal.      STOP taking these medications     Multiple Vitamin (MULTIVITAMIN WITH MINERALS) TABS tablet            Management plans discussed with the patient and she is in agreement. Stable for discharge home  Patient should follow up with cardiology  CODE STATUS:     Code Status Orders        Start     Ordered   01/26/17 0546  Full code  Continuous     01/26/17 0545    Code Status History    Date Active Date Inactive Code Status Order ID Comments User Context   This patient has a current code status but no historical code status.      TOTAL TIME TAKING CARE OF THIS PATIENT: 38 minutes.    Note: This dictation was prepared with Dragon dictation along with smaller phrase technology. Any transcriptional errors that result from this process are unintentional.  Lady Wisham M.D on 01/26/2017  at 10:47 AM  Between 7am to 6pm - Pager - [phone] After 6pm go to [email or web address] - Social research officer, governmentpassword EPAS [facility]  [facility] [facility]  Office  [phone]  CC: Primary care physician; No PCP Per Patient

## 2017-01-26 NOTE — Progress Notes (Signed)
Patient seen and evaluated. We'll await cardio  consultation for disposition planning. Patient is now converted normal sinus rhythm. Follow up on echo

## 2017-01-26 NOTE — Progress Notes (Signed)
   Patient is a admitted to room 239 with the diagnosis of new onset of Afib. Alert and oriented x 4. Denied any acute pain. Tele box called to CCMD with  Trey PaulaJeff  as a second verifier. Skin assessment  Done with Hansel StarlingAdrienne RN, no issues to report. Fall Dispensing opticiancontract signed. Bed alarm is in the lowest position. Will continue to monitor.

## 2017-01-26 NOTE — H&P (Signed)
History and Physical   [facility] [place] [facility] Admission History and Physical [employer] Corporation[employer]is Jenita Rayfield, D.O.    Patient Name: Cindy HarmanShekeitha Baik MR#: [id number] Date of Birth: 1976/03/18 Date of Admission: 01/25/2017  Referring MD/NP/PA: Dr. York CeriseForbach Primary Care Physician: No primary care provider on file. Outpatient Specialists: None  Patient coming from: Home  Chief Complaint: LOC  HPI: Cindy HarmanShekeitha Walther is a 41 y.o. female with a known history of hypertension was in a usual state of health until this evening when she sustained a sudden loss of consciousness while standing.  States that she had three alcoholic beverages which she usually does not do.  Denies any preceding symptoms such as dizziness, lightheadedness, palpitations, chest pain, SOB.    Otherwise there has been no change in status. Patient has been taking medication as prescribed with the exception of metoprolol, which she is supposed to take BID, but she often forgets evening dose.  Last dose was this morning.  There has been no recent change in medication or diet.  No recent antibiotics.  There has been no recent illness, hospitalizations, travel or sick contacts.    ED Course: Patient received aspirin full dose, Lopressor IV and NS bolus.  Review of Systems:  CONSTITUTIONAL: No fever/chills, fatigue, weakness, weight gain/loss, headache. EYES: No blurry or double vision. ENT: No tinnitus, postnasal drip, redness or soreness of the oropharynx. RESPIRATORY: No cough, dyspnea, wheeze.  No hemoptysis.  CARDIOVASCULAR: No chest pain, palpitations, syncope, orthopnea. No lower extremity edema.  GASTROINTESTINAL: No nausea, vomiting, abdominal pain, diarrhea, constipation.  No hematemesis, melena or hematochezia. GENITOURINARY: No dysuria, frequency, hematuria. ENDOCRINE: No polyuria or nocturia. No heat or cold intolerance. HEMATOLOGY: No anemia, bruising, bleeding. INTEGUMENTARY: No rashes, ulcers,  lesions. MUSCULOSKELETAL: No arthritis, gout, dyspnea. NEUROLOGIC: No numbness, tingling, ataxia, seizure-type activity, weakness.  Positive LOC as per HPI PSYCHIATRIC: No anxiety, depression, insomnia.   Past Medical History:  Diagnosis Date  . Hypertension     Past Surgical History:  Procedure Laterality Date  . CHOLECYSTECTOMY    . GASTRIC BYPASS    . TUBAL LIGATION       reports that she has never smoked. She has never used smokeless tobacco. She reports that she drinks alcohol. She reports that she does not use drugs.  Allergies  Allergen Reactions  . Septra [Sulfamethoxazole-Trimethoprim] Rash    Mother with CHF, father alive and well. Family history has been reviewed and confirmed with patient.   Prior to Admission medications   Medication Sig Start Date End Date Taking? Authorizing Provider  amLODipine (NORVASC) 5 MG tablet Take 5 mg by mouth daily.   Yes Historical Provider, MD  metoprolol succinate (TOPROL-XL) 50 MG 24 hr tablet Take 50 mg by mouth daily. Take with or immediately following a meal.   Yes Historical Provider, MD  Multiple Vitamin (MULTIVITAMIN WITH MINERALS) TABS tablet Take 1 tablet by mouth daily.    Historical Provider, MD    Physical Exam: Vitals:   01/25/17 2342 01/25/17 2345 01/26/17 0000 01/26/17 0030  BP:   104/67 108/77  Pulse:  (!) 117    Resp:  18 18 16   Temp:      TempSrc:      SpO2:  98%    Weight: 89.8 kg (198 lb)     Height: 5\' 3"  (1.6 m)       GENERAL: 41 y.o.-year-old female patient, well-developed, well-nourished lying in the bed in no acute distress.  Pleasant and cooperative.   HEENT:  Head atraumatic, normocephalic. Pupils equal, round, reactive to light and accommodation. No scleral icterus. Extraocular muscles intact. Nares are patent. Oropharynx is clear. Mucus membranes moist. NECK: Supple, full range of motion. No JVD, no bruit heard. No thyroid enlargement, no tenderness, no cervical lymphadenopathy. CHEST: Normal  breath sounds bilaterally. No wheezing, rales, rhonchi or crackles. No use of accessory muscles of respiration.  No reproducible chest wall tenderness.  CARDIOVASCULAR: Irregularly irregular. No murmurs, rubs, or gallops. Cap refill <2 seconds. Pulses intact distally.  ABDOMEN: Soft, nondistended, nontender. No rebound, guarding, rigidity. Normoactive bowel sounds present in all four quadrants. No organomegaly or mass. EXTREMITIES: No pedal edema, cyanosis, or clubbing. No calf tenderness or Homan's sign.  NEUROLOGIC: The patient is alert and oriented x 3. Cranial nerves II through XII are grossly intact with no focal sensorimotor deficit. Muscle strength 5/5 in all extremities. Sensation intact. Gait not checked. PSYCHIATRIC:  Normal affect, mood, thought content. SKIN: Warm, dry, and intact without obvious rash, lesion, or ulcer.    Labs on Admission:  CBC: No results for input(s): WBC, NEUTROABS, HGB, HCT, MCV, PLT in the last 168 hours. Basic Metabolic Panel:  Recent Labs Lab 01/25/17 2351  NA 141  K 3.7  CL 115*  CO2 18*  GLUCOSE 53*  BUN 7  CREATININE 0.80  CALCIUM 8.4*  MG 2.1   GFR: Estimated Creatinine Clearance: 99.5 mL/min (by C-G formula based on SCr of 0.8 mg/dL). Liver Function Tests:  Recent Labs Lab 01/25/17 2351  AST 34  ALT 17  ALKPHOS 88  BILITOT 0.9  PROT 7.3  ALBUMIN 3.9    Recent Labs Lab 01/25/17 2351  LIPASE 25   No results for input(s): AMMONIA in the last 168 hours. Coagulation Profile: No results for input(s): INR, PROTIME in the last 168 hours. Cardiac Enzymes:  Recent Labs Lab 01/25/17 2351  TROPONINI <0.03   BNP (last 3 results) No results for input(s): PROBNP in the last 8760 hours. HbA1C: No results for input(s): HGBA1C in the last 72 hours. CBG: No results for input(s): GLUCAP in the last 168 hours. Lipid Profile: No results for input(s): CHOL, HDL, LDLCALC, TRIG, CHOLHDL, LDLDIRECT in the last 72 hours. Thyroid  Function Tests: No results for input(s): TSH, T4TOTAL, FREET4, T3FREE, THYROIDAB in the last 72 hours. Anemia Panel: No results for input(s): VITAMINB12, FOLATE, FERRITIN, TIBC, IRON, RETICCTPCT in the last 72 hours. Urine analysis:    Component Value Date/Time   COLORURINE STRAW (A) 01/26/2017 0026   APPEARANCEUR CLEAR (A) 01/26/2017 0026   LABSPEC 1.003 (L) 01/26/2017 0026   PHURINE 7.0 01/26/2017 0026   GLUCOSEU NEGATIVE 01/26/2017 0026   HGBUR NEGATIVE 01/26/2017 0026   BILIRUBINUR NEGATIVE 01/26/2017 0026   KETONESUR NEGATIVE 01/26/2017 0026   PROTEINUR NEGATIVE 01/26/2017 0026   NITRITE NEGATIVE 01/26/2017 0026   LEUKOCYTESUR NEGATIVE 01/26/2017 0026   Sepsis Labs: @LABRCNTIP (procalcitonin:4,lacticidven:4) )No results found for this or any previous visit (from the past 240 hour(s)).   Radiological Exams on Admission: Dg Chest 2 View  Result Date: 01/26/2017 CLINICAL DATA:  Syncopal episode at a restaurant. EXAM: CHEST  2 VIEW COMPARISON:  10/01/2013 FINDINGS: Segmental elevation of the left hemidiaphragm. Surgical clips in the left upper quadrant. Normal heart size and pulmonary vascularity. No focal airspace disease or consolidation in the lungs. No blunting of costophrenic angles. No pneumothorax. Mediastinal contours appear intact. IMPRESSION: No active cardiopulmonary disease. Electronically Signed   By: Burman Nieves M.D.   On: 01/26/2017 00:50  EKG: Atrial fibrillation with RVR at 102 bpm normal axis and nonspecific ST-T wave changes.   Assessment/Plan    This is a 41 y.o. female with a history of Hypertension now being admitted with:  1. New onset afib with RVR - Admit inpatient with telemetry - Rate control with beta blocker, digoxin if hypotensive.   - Trend trops, check lipids, TSH - Check echo - CHADSVASC - 1 Will use aspirin 81mg  daily and heparin for DVT px.   2. Syncope, likely 2/2 #1 - Check echo / carotids - Neurochecks q4h  3. History of  hypertension - Continue metoprolol BID and Norvasc  Admission status: Inpatient, tele IV Fluids: NS Diet/Nutrition: NPO Consults called: Cardio  DVT Px: Heparin, SCDs and early ambulation. Code Status: Full Code  Disposition Plan: To home in 1-2 days   All the records are reviewed and case discussed with ED provider. Management plans discussed with the patient and/or family who express understanding and agree with plan of care.  Michelangelo Rindfleisch D.O. on 01/26/2017 at 1:11 AM Between 7am to 6pm - Pager - [phone] After 6pm go to [email or web address] - Biomedical engineer [facility] Office [phone] CC: Primary care physician; No primary care provider on file.   01/26/2017, 1:11 AM

## 2017-01-26 NOTE — ED Notes (Signed)
New EKG has been taken and exported per RN request

## 2017-01-27 ENCOUNTER — Telehealth: Payer: Self-pay

## 2017-01-27 DIAGNOSIS — I4891 Unspecified atrial fibrillation: Secondary | ICD-10-CM

## 2017-01-27 DIAGNOSIS — R55 Syncope and collapse: Secondary | ICD-10-CM

## 2017-01-27 NOTE — Telephone Encounter (Signed)
-----   Message from Sondra Bargesyan M Dunn, PA-C sent at 01/27/2017  7:06 AM EST ----- Dr. Elberta Fortisamnitz saw this lady over the weekend and would like hospital follow up in 10-14 days and a 30 day event monitor. Dx: Syncope, new onset Afib.  Thanks!

## 2017-01-27 NOTE — Telephone Encounter (Signed)
Spoke w/ Cindy Payne.  She is agreeable to wearing 30 day monitor. Advised her that she will receive a call from [employer] to verify her address before they mail the monitor to her.  She verbalizes understanding and will keep upcoming appt w/ Dr. Alvino ChapelIngal.

## 2017-01-28 LAB — HIV ANTIBODY (ROUTINE TESTING W REFLEX): HIV Screen 4th Generation wRfx: NONREACTIVE

## 2017-01-28 LAB — HEMOGLOBIN A1C
HEMOGLOBIN A1C: 5.2 % (ref 4.8–5.6)
Mean Plasma Glucose: 103 mg/dL

## 2017-01-30 ENCOUNTER — Encounter: Payer: Self-pay | Admitting: Cardiology

## 2017-01-30 ENCOUNTER — Ambulatory Visit (INDEPENDENT_AMBULATORY_CARE_PROVIDER_SITE_OTHER): Payer: BLUE CROSS/BLUE SHIELD | Admitting: Cardiology

## 2017-01-30 ENCOUNTER — Telehealth: Payer: Self-pay | Admitting: Cardiology

## 2017-01-30 VITALS — BP 122/80 | HR 51 | Ht 63.0 in | Wt 199.5 lb

## 2017-01-30 DIAGNOSIS — I480 Paroxysmal atrial fibrillation: Secondary | ICD-10-CM

## 2017-01-30 DIAGNOSIS — I10 Essential (primary) hypertension: Secondary | ICD-10-CM

## 2017-01-30 DIAGNOSIS — R55 Syncope and collapse: Secondary | ICD-10-CM

## 2017-01-30 NOTE — Patient Instructions (Addendum)
Medication Instructions:  No changes at this time.  Check with your insurance company regarding cost of Eliquis 5 mg twice a day  Labwork: Your physician recommends that you return for lab work in: 3 months for CMP and CBC   Testing/Procedures: 30 day monitor should arrive in the mail.   Follow-Up: Your physician wants you to follow-up in: 6 months. You will receive a reminder letter in the mail two months in advance. If you don't receive a letter, please call our office to schedule the follow-up appointment.  It was a pleasure seeing you today here in the office. Please do not hesitate to give [place] a call back if you have any further questions. [phone]  Bluffs Cellar RN, BSN     Cardiac Event Monitoring A cardiac event monitor is a small recording device used to help detect abnormal heart rhythms (arrhythmias). The monitor is used to record heart rhythm when noticeable symptoms such as the following occur:  Fast heartbeats (palpitations), such as heart racing or fluttering.  Dizziness.  Fainting or light-headedness.  Unexplained weakness. The monitor is wired to two electrodes placed on your chest. Electrodes are flat, sticky disks that attach to your skin. The monitor can be worn for up to 30 days. You will wear the monitor at all times, except when bathing. How to use your cardiac event monitor A technician will prepare your chest for the electrode placement. The technician will show you how to place the electrodes, how to work the monitor, and how to replace the batteries. Take time to practice using the monitor before you leave the office. Make sure you understand how to send the information from the monitor to your health care provider. This requires a telephone with a landline, not a cell phone. You need to:  Wear your monitor at all times, except when you are in water:  Do not get the monitor wet.  Take the monitor off when bathing. Do not swim or use a hot tub with it  on.  Keep your skin clean. Do not put body lotion or moisturizer on your chest.  Change the electrodes daily or any time they stop sticking to your skin. You might need to use tape to keep them on.  It is possible that your skin under the electrodes could become irritated. To keep this from happening, try to put the electrodes in slightly different places on your chest. However, they must remain in the area under your left breast and in the upper right section of your chest.  Make sure the monitor is safely clipped to your clothing or in a location close to your body that your health care provider recommends.  Press the button to record when you feel symptoms of heart trouble, such as dizziness, weakness, light-headedness, palpitations, thumping, shortness of breath, unexplained weakness, or a fluttering or racing heart. The monitor is always on and records what happened slightly before you pressed the button, so do not worry about being too late to get good information.  Keep a diary of your activities, such as walking, doing chores, and taking medicine. It is especially important to note what you were doing when you pushed the button to record your symptoms. This will help your health care provider determine what might be contributing to your symptoms. The information stored in your monitor will be reviewed by your health care provider alongside your diary entries.  Send the recorded information as recommended by your health care provider. It is  important to understand that it will take some time for your health care provider to process the results.  Change the batteries as recommended by your health care provider. Get help right away if:  You have chest pain.  You have extreme difficulty breathing or shortness of breath.  You develop a very fast heartbeat that persists.  You develop dizziness that does not go away.  You faint or constantly feel you are about to faint. This information  is not intended to replace advice given to you by your health care provider. Make sure you discuss any questions you have with your health care provider. Document Released: 09/03/2008 Document Revised: 05/02/2016 Document Reviewed: 05/24/2013 Elsevier Interactive Patient Education  2017 [employer]

## 2017-01-30 NOTE — Telephone Encounter (Signed)
Left voicemail message to call back  

## 2017-01-30 NOTE — Telephone Encounter (Signed)
Pt calling stating she was advised by ED to not drive for 6 months for her passing out spell she had.  She is calling to follow up on this Wants to know if she and when she is okay to drive  Please advise.

## 2017-01-30 NOTE — Progress Notes (Signed)
Cardiology Office Note   Date:  01/30/2017   ID:  Cindy Payne, DOB 1976/08/27, MRN [id number]  Referring Doctor:  Marguerita Merles, MD   Cardiologist:   Wende Bushy, MD   Reason for consultation:  Chief Complaint  Patient presents with  . other    New Patient. Seen in hospital for A-fib wit RVR/syncope. Pt states she is doing well. Reviewed meds with pt verbally.      History of Present Illness: Cindy Payne is a 41 y.o. female who presents for Hospital follow-up for Moderate complexity medical need  Patient was admitted January 26 2017 for episode of syncope. EKG revealed atrial fibrillation with RVR. She spontaneously converted prior her medical records.  She was discharged with the diagnoses of atrial fibrillation, paroxysmal. She was started on oral anticoagulation. She was set up for a 30 day monitor as well as office visits with cardiology.  Patient reports that her high blood pressure has been stable on her medications including amlodipine and metoprolol. She was started on metoprolol after GI surgery for bowel obstruction because her heart rate was noted to be elevated. She was not diagnosed with any arrhythmia at that time.  She has no symptoms of chest pain or shortness of breath. She does not recall and having any palpitations during the time of the syncope. It is unclear how she passed out. He may have been related to having possibly 2 alcoholic drinks at that time.  Patient denies PND, orthopnea, edema. She had a sleep study prior to bariatric surgery and was told that she had no sleep apnea at that time.  ROS:  Please see the history of present illness. Aside from mentioned under HPI, all other systems are reviewed and negative.     Past Medical History:  Diagnosis Date  . Hypertension     Past Surgical History:  Procedure Laterality Date  . CHOLECYSTECTOMY    . GASTRIC BYPASS    . TUBAL LIGATION       reports that she has never smoked.  She has never used smokeless tobacco. She reports that she drinks alcohol. She reports that she does not use drugs.   family history includes Congestive Heart Failure in her mother; Dementia in her maternal grandmother; Hypertension in her mother and sister.   Outpatient Medications Prior to Visit  Medication Sig Dispense Refill  . amLODipine (NORVASC) 5 MG tablet Take 5 mg by mouth daily.    Marland Kitchen aspirin EC 81 MG EC tablet Take 1 tablet (81 mg total) by mouth daily. 120 tablet 0  . metoprolol succinate (TOPROL-XL) 50 MG 24 hr tablet Take 50 mg by mouth daily. Take with or immediately following a meal.    . rivaroxaban (XARELTO) 20 MG TABS tablet Take 1 tablet (20 mg total) by mouth daily with supper. 30 tablet 0   No facility-administered medications prior to visit.      Allergies: Septra [sulfamethoxazole-trimethoprim]    PHYSICAL EXAM: VS:  BP 122/80 (BP Location: Right Arm, Patient Position: Sitting, Cuff Size: Normal)   Pulse (!) 51   Ht _0  (1.6 m)   Wt 199 lb 8 oz (90.5 kg)   BMI 35.34 kg/m  , Body mass index is 35.34 kg/m. Wt Readings from Last 3 Encounters:  01/30/17 199 lb 8 oz (90.5 kg)  01/25/17 198 lb (89.8 kg)  08/09/15 195 lb (88.5 kg)    GENERAL:  well developed, well nourished, obese, not in acute distress HEENT:  normocephalic, pink conjunctivae, anicteric sclerae, no xanthelasma, normal dentition, oropharynx clear NECK:  no neck vein engorgement, JVP normal, no hepatojugular reflux, carotid upstroke brisk and symmetric, no bruit, no thyromegaly, no lymphadenopathy LUNGS:  good respiratory effort, clear to auscultation bilaterally CV:  PMI not displaced, no thrills, no lifts, S1 and S2 within normal limits, no palpable S3 or S4, no murmurs, no rubs, no gallops ABD:  Soft, nontender, nondistended, normoactive bowel sounds, no abdominal aortic bruit, no hepatomegaly, no splenomegaly MS: nontender back, no kyphosis, no scoliosis, no joint deformities EXT:  2+ DP/PT  pulses, no edema, no varicosities, no cyanosis, no clubbing SKIN: warm, nondiaphoretic, normal turgor, no ulcers NEUROPSYCH: alert, oriented to person, place, and time, sensory/motor grossly intact, normal mood, appropriate affect  Recent Labs: 01/25/2017: ALT 17 01/26/2017: BUN 7; Creatinine, Ser 0.71; Hemoglobin 12.0; Magnesium 1.9; Platelets 252; Potassium 4.0; Sodium 143; TSH 1.473   Lipid Panel    Component Value Date/Time   CHOL 82 01/26/2017 0621   TRIG 42 01/26/2017 0621   HDL 39 (L) 01/26/2017 0621   CHOLHDL 2.1 01/26/2017 0621   VLDL 8 01/26/2017 0621   LDLCALC 35 01/26/2017 0621     Other studies Reviewed:  EKG:  The ekg from02/22/2018 was personally reviewed by me and it revealed sinus bradycardia, 51 BPM.  Additional studies/ records that were reviewed personally reviewed by me today include: Echo 01/26/2017: Left ventricle: The cavity size was normal. Systolic function was   normal. The estimated ejection fraction was in the range of 60%   to 65%. Wall motion was normal; there were no regional wall   motion abnormalities. Left ventricular diastolic function   parameters were normal. - Left atrium: The atrium was normal in size. - Right ventricle: Systolic function was normal. - Pulmonary arteries: Systolic pressure was within the normal   range.    ASSESSMENT AND PLAN: Paroxysmal atrial fibrillation, currently in sinus rhythm Continue metoprolol that she already takes for blood pressure. Follow-up on 30 day monitor. Continue with Xarelto or switch to whatever is better covered by her insurance.CHADS2-VASc=2, hypertension and female gender. We had a lengthy and detailed discussion about her diagnosis of atrial fibrillation, the nature of the disease, pathophysiology, and risks of stroke. Check CMP and CBC in 3 months.  Hypertension BP is well controlled. Continue monitoring BP. Continue current medical therapy and lifestyle changes.  Syncope, no  recurrence  Echo cardiac shows preserved ejection fraction. No symptoms of chest pain or shortness of breath. Await 30 day monitor to determine possible conversion pauses or any significant arrhythmia that may lead to syncope Per medical records from the hospital, she should not drive for the next 6 months as determined by [facility].   Current medicines are reviewed at length with the patient today.  The patient does not have concerns regarding medicines.  Labs/ tests ordered today include:  Orders Placed This Encounter  Procedures  . CBC with Differential/Platelet  . Comp Met (CMET)  . EKG 12-Lead    I had a lengthy and detailed discussion with the patient regarding diagnoses, prognosis, diagnostic options, treatment options , and side effects of medications.   I counseled the patient on importance of lifestyle modification including heart healthy diet, regular physical activity.    Disposition:   FU with undersigned 6 months   Signed, Almond Lint, MD  01/30/2017 11:14 AM    [facility]  This note was generated in part with voice recognition software  and I apologize for any typographical errors that were not detected and corrected.

## 2017-01-30 NOTE — Telephone Encounter (Signed)
Patient calling back in and wanted to know if she could start driving. Let her know that per her chart she is not to drive for 6 months per [place] Law. She states that she is a single mother with 2 kids and working 2 jobs. Let her know that this is listed in her chart and that we will see what her testing results show. She verbalized understanding and had no further questions at this time.

## 2017-01-31 ENCOUNTER — Encounter (INDEPENDENT_AMBULATORY_CARE_PROVIDER_SITE_OTHER): Payer: BLUE CROSS/BLUE SHIELD

## 2017-01-31 DIAGNOSIS — I4891 Unspecified atrial fibrillation: Secondary | ICD-10-CM

## 2017-01-31 DIAGNOSIS — R55 Syncope and collapse: Secondary | ICD-10-CM

## 2018-07-19 ENCOUNTER — Encounter: Payer: Self-pay | Admitting: Emergency Medicine

## 2018-07-19 ENCOUNTER — Emergency Department
Admission: EM | Admit: 2018-07-19 | Discharge: 2018-07-19 | Disposition: A | Discharge status: Home / Self Care | Payer: BLUE CROSS/BLUE SHIELD | Attending: Emergency Medicine | Admitting: Emergency Medicine

## 2018-07-19 ENCOUNTER — Other Ambulatory Visit: Payer: Self-pay

## 2018-07-19 DIAGNOSIS — Z7901 Long term (current) use of anticoagulants: Secondary | ICD-10-CM | POA: Insufficient documentation

## 2018-07-19 DIAGNOSIS — I10 Essential (primary) hypertension: Secondary | ICD-10-CM | POA: Diagnosis not present

## 2018-07-19 DIAGNOSIS — I4891 Unspecified atrial fibrillation: Secondary | ICD-10-CM | POA: Insufficient documentation

## 2018-07-19 DIAGNOSIS — Z79899 Other long term (current) drug therapy: Secondary | ICD-10-CM | POA: Insufficient documentation

## 2018-07-19 DIAGNOSIS — Z7982 Long term (current) use of aspirin: Secondary | ICD-10-CM | POA: Insufficient documentation

## 2018-07-19 DIAGNOSIS — R55 Syncope and collapse: Secondary | ICD-10-CM | POA: Insufficient documentation

## 2018-07-19 LAB — URINALYSIS, COMPLETE (UACMP) WITH MICROSCOPIC
BILIRUBIN URINE: NEGATIVE
Glucose, UA: NEGATIVE mg/dL
KETONES UR: NEGATIVE mg/dL
LEUKOCYTES UA: NEGATIVE
Nitrite: NEGATIVE
Protein, ur: NEGATIVE mg/dL
Specific Gravity, Urine: 1.017 (ref 1.005–1.030)
pH: 7 (ref 5.0–8.0)

## 2018-07-19 LAB — BASIC METABOLIC PANEL
ANION GAP: 4 — AB (ref 5–15)
BUN: 11 mg/dL (ref 6–20)
CALCIUM: 8.4 mg/dL — AB (ref 8.9–10.3)
CHLORIDE: 111 mmol/L (ref 98–111)
CO2: 24 mmol/L (ref 22–32)
Creatinine, Ser: 0.76 mg/dL (ref 0.44–1.00)
GFR calc non Af Amer: >60 mL/min (ref >60)
Glucose, Bld: 106 mg/dL — ABNORMAL HIGH (ref 70–99)
Potassium: 3.7 mmol/L (ref 3.5–5.1)
Sodium: 139 mmol/L (ref 135–145)

## 2018-07-19 LAB — CBC
HCT: 35 % (ref 35.0–47.0)
HEMOGLOBIN: 11.7 g/dL — AB (ref 12.0–16.0)
MCH: 28.9 pg (ref 26.0–34.0)
MCHC: 33.5 g/dL (ref 32.0–36.0)
MCV: 86.2 fL (ref 80.0–100.0)
Platelets: 251 10*3/uL (ref 150–440)
RBC: 4.06 MIL/uL (ref 3.80–5.20)
RDW: 12.4 % (ref 11.5–14.5)
WBC: 8.3 10*3/uL (ref 3.6–11.0)

## 2018-07-19 LAB — GLUCOSE, CAPILLARY: Glucose-Capillary: 89 mg/dL (ref 70–99)

## 2018-07-19 LAB — TROPONIN I

## 2018-07-19 MED ORDER — SODIUM CHLORIDE 0.9 % IV BOLUS
1000.0000 mL | Freq: Once | INTRAVENOUS | Status: AC
  Administered 2018-07-19: 1000 mL via INTRAVENOUS

## 2018-07-19 NOTE — ED Triage Notes (Signed)
Pt arrived via POV from home with reports of 2 syncopal episodes today. Pt states prior to passing out she states she felt hot and her ears were full.   Pt states she has had similar sxs last year when she was diagnosed with afib.  Pt states she takes metoprolol daily for afib.

## 2018-07-19 NOTE — ED Provider Notes (Signed)
[facility] Emergency Department Provider Note  Time seen: 5:28 PM  I have reviewed the triage vital signs and the nursing notes.   HISTORY  Chief Complaint Loss of Consciousness    HPI Cindy Payne is a 42 y.o. female with a past medical history of atrial fibrillation, hypertension, presents to the emergency department after syncopal event.  According to the patient she was outside talking to someone, states she was standing for approximately 15 minutes when she began feeling very warm remembers hearing and echoey sound in her ears and then she almost passed out.  Patient states she went down to the ground but did not completely lose consciousness.  She was lying on the ground for several minutes until she felt better and then attempted to stand up and then once again fell down to the ground almost passing completely out.  Patient denies any chest pain or shortness of breath at any point.  Patient did state approximate 1 year ago a similar event happened while she was at a restaurant standing at the register waiting to pay she began feeling lightheaded and passed out.  States a history of paroxysmal atrial fibrillation release was told she could have been having A. fib at that time.  Denies any palpitations or rapid heart rate.  Denies any infectious symptoms, largely negative review of systems.   Past Medical History:  Diagnosis Date  . A-fib (HCC)   . Hypertension     Patient Active Problem List   Diagnosis Date Noted  . New onset atrial fibrillation (HCC) 01/26/2017  . A-fib (HCC) 01/26/2017  . Atrial fibrillation (HCC) 01/26/2017    Past Surgical History:  Procedure Laterality Date  . CHOLECYSTECTOMY    . GASTRIC BYPASS    . TUBAL LIGATION      Prior to Admission medications   Medication Sig Start Date End Date Taking? Authorizing Provider  amLODipine (NORVASC) 5 MG tablet Take 5 mg by mouth daily.    [provider]  aspirin EC 81  MG EC tablet Take 1 tablet (81 mg total) by mouth daily. 01/26/17   Adrian Saran, MD  Cyanocobalamin (B-12) 500 MCG TABS Take 500 mcg by mouth daily.    [provider]  metoprolol succinate (TOPROL-XL) 50 MG 24 hr tablet Take 50 mg by mouth daily. Take with or immediately following a meal.    [provider]  Multiple Vitamin (MULTIVITAMIN) tablet Take 1 tablet by mouth daily.    [provider]  rivaroxaban (XARELTO) 20 MG TABS tablet Take 1 tablet (20 mg total) by mouth daily with supper. 01/26/17   Adrian Saran, MD    Allergies  Allergen Reactions  . Septra [Sulfamethoxazole-Trimethoprim] Rash    Family History  Problem Relation Age of Onset  . Congestive Heart Failure Mother   . Hypertension Mother   . Hypertension Sister   . Dementia Maternal Grandmother     Social History Social History   Tobacco Use  . Smoking status: Never Smoker  . Smokeless tobacco: Never Used  Substance Use Topics  . Alcohol use: Yes  . Drug use: No    Review of Systems Constitutional: Negative for fever. Cardiovascular: Negative for chest pain. Respiratory: Negative for shortness of breath. Gastrointestinal: Negative for abdominal pain, vomiting and diarrhea. Genitourinary: Negative for urinary compaints Musculoskeletal: Negative for musculoskeletal complaints Skin: Negative for skin complaints  Neurological: Negative for headache All other ROS negative  ____________________________________________   PHYSICAL EXAM:  VITAL SIGNS:  ED Triage Vitals [07/19/18 1635]  Enc Vitals Group     BP 123/64     Pulse Rate 72     Resp 16     Temp 98.3 F (36.8 C)     Temp Source Oral     SpO2 100 %     Weight 208 lb (94.3 kg)     Height 5\' 3"  (1.6 m)     Head Circumference      Peak Flow      Pain Score 0     Pain Loc      Pain Edu?      Excl. in GC?    Constitutional: Alert and oriented. Well appearing and in no distress. Eyes: Normal exam ENT   Head:  Normocephalic and atraumatic.   Mouth/Throat: Mucous membranes are moist. Cardiovascular: Normal rate, regular rhythm. No murmur Respiratory: Normal respiratory effort without tachypnea nor retractions. Breath sounds are clear  Gastrointestinal: Soft and nontender. No distention.  Musculoskeletal: Nontender with normal range of motion in all extremities.  Neurologic:  Normal speech and language. No gross focal neurologic deficits  Skin:  Skin is warm, dry and intact.  Psychiatric: Mood and affect are normal.   ____________________________________________    EKG  EKG reviewed and interpreted by myself shows normal sinus rhythm at 65 bpm with a narrow QRS, normal axis, normal intervals, no ST changes.  ____________________________________________   INITIAL IMPRESSION / ASSESSMENT AND PLAN / ED COURSE  Pertinent labs & imaging results that were available during my care of the patient were reviewed by me and considered in my medical decision making (see chart for details).  Patient presents emergency department after 2 near syncopal episodes today.  They occurred while the patient was standing upright for approximately 15 minutes.  Differential would include vasovagal syncope, orthostatic syncope, dehydration less likely ACS.  We will check labs, including cardiac panel.  Reassuringly patient's EKG appears very normal.  We will attempt IV hydrate.  Patient is currently drinking water.  Patient continues to feel well.  Labs are resulted normal including normal chemistry and negative troponin.  We will let the remainder of the patient's IV fluids infused and then discharged with PCP follow-up.  I discussed with the patient increasing her hydration over the next several days obtaining plenty of rest.  I also discussed return precautions for any further events.  ____________________________________________   FINAL CLINICAL IMPRESSION(S) / ED DIAGNOSES  Syncope    Minna AntisPaduchowski, Lera Gaines,  MD 07/19/18 1844

## 2018-10-27 NOTE — Progress Notes (Signed)
Cardiology Office Note Date:  10/28/2018  Patient ID:  Cindy Payne, Cindy Payne [birth year], MRN [id number] PCP:  Leanna Sato, MD  Cardiologist:  Former Dr. Alvino Chapel patient; will establish with MD in follow up.     Chief Complaint: Palpitations/near syncope  History of Present Illness: Cindy Payne is a 42 y.o. female with history of PAF on Xarelto, hypertension, bowel obstruction, and morbid obesity status post gastric bypass surgery who presents for evaluation of palpitations and presyncope.   Patient was previous admitted to [facility] with a large diaphragmatic hernia as well as open resection of small bowel segment secondary to obstruction and underwent successful surgical repair of this.  Postoperative course was complicated by persistent leukocytosis, shortness of breath, and left-sided pleural effusion.  Echocardiogram during that admission showed an EF of 65 to 70%, normal RV size and systolic function, normal bilateral atrial size, no significant valvular disease.  She ultimately underwent successful left-sided thoracentesis.  Patient was admitted in 01/2017 with syncope when standing follow up 3 alcoholic beverages. EKG during that admission showed Afib with RVR. She was anticoagulated with Xarelto and spontaneously converted to sinus prior to discharge per IM note. Echo on 01/26/17 showed an EF of 60-65%, no RWMA, normal LV diastolic function, left atrium normal in size, RV systolic function normal, PASP normal. Carotid artery ultrasound showed less than 50% bilateral ICA stenosis. Labs at that time showed a normal TSH, potassium 3.7-->4.0, magnesium 2.1, A1c 5.2, LDL 35, CBC unremarkable. Outpatient cardiac monitoring showed a baseline sinus rhythm with an average heart rate of 64 bpm. Patient detected events corresponded to sinus rhythm and sinus arrhythmia. No significant arrhythmia was noted.   In follow up with cardiology in 01/2017 she noted she was initially started on metoprolol  following GI surgery for bowel obstruction secondary to elevated heart rates.  It is noted the patient was not diagnosed with an arrhythmia at that time.  She was noted to be in sinus bradycardia with a heart rate of 51 bpm in hospital follow-up.  She was previously followed by Dr. Alvino Chapel though has been lost to follow-up since her initial visit on 01/30/2017.  She was seen in the ED on 07/19/2018 following presyncopal episode.  ED notes indicated the patient was outside talking to someone and standing for approximately 15 minutes when she be began to feel warm and hearing an echo sound in her ears followed by near syncope.  She was able to get down to the ground without loss of consciousness.  Upon attempting to stand back up she again felt dizzy and had associated near syncope.  EKG in the ED demonstrated sinus rhythm with a heart rate of 65 bpm.  Imaging was not performed.  Troponin negative x1, hemoglobin 11.7, potassium 3.7, serum creatinine 0.76, glucose 106.  Vitals normal.  Patient was given IV fluids with improvement in symptoms and discharged to outpatient follow-up with PCP.  Labs drawn by PCP office on 10/22/2018 showed hemoglobin of 10.9 baseline approximately 11.9, WBC 5.4, platelet count 306, serum creatinine 0.75, potassium 4.1, LFT normal, LDL 49, A1c 5.6, TSH 1.91  Patient comes in doing well from a cardiac perspective today.  She noted an episode of tachypalpitations with associated dizziness and presyncope on 10/22/2018 while walking through the dollar store.  She is unclear exactly how long the symptoms lasted however states it lasted for several minutes.  She was able to walk out to her car sit down with improvement in symptoms and drove  home.  No associated shortness of breath, diaphoresis, nausea, vomiting, or chest pain.  This was the first episode of presyncope she has had since she was seen in the ED in 07/2018.  She asks today if this could have been a panic attack though she states she  is generally not an anxious person and was not feeling anxious at that time.  She indicates that she regularly goes to that dollar start without issues.  Since then, she has felt well.  She remains compliant with Xarelto and amlodipine.  Patient verified that she is taking Lopressor 50 mg twice daily, we will update our MAR.  Patient is unsure which medication she is actually taking.  She denies any falls since she was last seen, BRBPR or melena.  She does note heavy menses since starting Xarelto.   Past Medical History:  Diagnosis Date  . Hypertension   . Morbid obesity (HCC)    a. status post gastric bypass  . PAF (paroxysmal atrial fibrillation) (HCC)    a.  Diagnosed 2/18; b. CHADS2VASc 2 (HTN, female)    Past Surgical History:  Procedure Laterality Date  . CHOLECYSTECTOMY    . GASTRIC BYPASS    . TUBAL LIGATION      Current Meds  Medication Sig  . amLODipine (NORVASC) 5 MG tablet Take 5 mg by mouth daily.  . Cyanocobalamin (B-12) 500 MCG TABS Take 500 mcg by mouth daily.  . metoprolol succinate (TOPROL-XL) 50 MG 24 hr tablet Take 50 mg by mouth daily. Take with or immediately following a meal.  . Multiple Vitamin (MULTIVITAMIN) tablet Take 1 tablet by mouth daily.  . rivaroxaban (XARELTO) 20 MG TABS tablet Take 1 tablet (20 mg total) by mouth daily with supper.    Allergies:   Septra [sulfamethoxazole-trimethoprim]   Social History:  The patient  reports that she has never smoked. She has never used smokeless tobacco. She reports that she drinks alcohol. She reports that she does not use drugs.   Family History:  The patient's family history includes Congestive Heart Failure in her mother; Dementia in her maternal grandmother; Hypertension in her mother and sister.  ROS:   Review of Systems  Constitutional: Positive for malaise/fatigue. Negative for chills, diaphoresis, fever and weight loss.  HENT: Negative for congestion.   Eyes: Negative for discharge and redness.    Respiratory: Negative for cough, hemoptysis, sputum production, shortness of breath and wheezing.   Cardiovascular: Positive for palpitations. Negative for chest pain, orthopnea, claudication, leg swelling and PND.  Gastrointestinal: Negative for abdominal pain, blood in stool, heartburn, melena, nausea and vomiting.  Genitourinary: Negative for hematuria.  Musculoskeletal: Negative for falls and myalgias.  Skin: Negative for rash.  Neurological: Positive for dizziness. Negative for tingling, tremors, sensory change, speech change, focal weakness, loss of consciousness and weakness.  Endo/Heme/Allergies: Does not bruise/bleed easily.  Psychiatric/Behavioral: Negative for substance abuse. The patient is not nervous/anxious.   All other systems reviewed and are negative.    PHYSICAL EXAM:  VS:  BP 134/82 (BP Location: Left Arm, Patient Position: Sitting, Cuff Size: Normal)   Pulse (!) 57   Ht 5\' 3"  (1.6 m)   Wt 214 lb (97.1 kg)   BMI 37.91 kg/m  BMI: Body mass index is 37.91 kg/m.  Physical Exam  Constitutional: She is oriented to person, place, and time. She appears well-developed and well-nourished.  HENT:  Head: Normocephalic and atraumatic.  Eyes: Right eye exhibits no discharge. Left eye exhibits no discharge.  Neck: Normal range of motion. No JVD present.  Cardiovascular: Regular rhythm, S1 normal, S2 normal and normal heart sounds. Bradycardia present. Exam reveals no distant heart sounds, no friction rub, no midsystolic click and no opening snap.  No murmur heard. Pulses:      Posterior tibial pulses are 2+ on the right side, and 2+ on the left side.  Pulmonary/Chest: Effort normal and breath sounds normal. No respiratory distress. She has no decreased breath sounds. She has no wheezes. She has no rales. She exhibits no tenderness.  Abdominal: Soft. She exhibits no distension. There is no tenderness.  Musculoskeletal: She exhibits no edema.  Neurological: She is alert and  oriented to person, place, and time.  Skin: Skin is warm and dry. No cyanosis. Nails show no clubbing.  Psychiatric: She has a normal mood and affect. Her speech is normal and behavior is normal. Judgment and thought content normal.      EKG:  Was ordered and interpreted by me today. Shows sinus bradycardia, 57 bpm, no acute st/t changes   Recent Labs: 07/19/2018: BUN 11; Creatinine, Ser 0.76; Hemoglobin 11.7; Platelets 251; Potassium 3.7; Sodium 139  No results found for requested labs within last 8760 hours.   CrCl cannot be calculated (Patient's most recent lab result is older than the maximum 21 days allowed.).   Wt Readings from Last 3 Encounters:  10/28/18 214 lb (97.1 kg)  07/19/18 208 lb (94.3 kg)  01/30/17 199 lb 8 oz (90.5 kg)    Orthostatic Vital Signs: Lying: Sitting: Standing: Standing x 3 minutes:  Other studies reviewed: Additional studies/records reviewed today include: summarized above  ASSESSMENT AND PLAN:  1. Palpitations/presyncope: Initial episode of presyncope in 01/2017 was without obvious etiology with unrevealing work-up at that time.  Subsequent episode of presyncope and 07/2018 was felt to be orthostasis in the setting of dehydration and standing outside in the heat for prolonged time.  Most recent episode of presyncope was in the setting of tachypalpitations.  He tachypalpitations are a new symptom for her at these were not associated with her presyncope in 01/2018 when she was documented to be in A. fib nor were they associated with her episode in 07/2018.  Schedule real-time ZIO monitor and echocardiogram.  Recent labs unrevealing as above.  If 0 monitor does not pick up on significant finding we may consider recommending Alive-Core as an option for her to monitor her heart rate and rhythm at home as I do not feel she warrants referral to EP for Linq at this time.  2. PAF: No evidence of A. fib on twelve-lead EKG today or twelve-lead EKG from PCP office on  10/22/2018.  Remains on Xarelto given CHADS2VASc of at least 2.  Continue rate control with metoprolol as below.  3. Sinus bradycardia: Decrease Lopressor to 25 mg twice daily.  Recent TSH and potassium unrevealing.  4. Hypertension: Blood pressure is reasonably controlled today.  With the de-escalation of metoprolol as above given her sinus bradycardia we may need to supplement with further antihypertensive.  5. Anemia: Follow-up with PCP.  Disposition: F/u with first available MD in 4 to 6 weeks to reestablish care.  Current medicines are reviewed at length with the patient today.  The patient did not have any concerns regarding medicines.  Signed, Eula Listen, PA-C 10/28/2018 4:27 PM     [facility] [place] [address] [place], [place] [address] [phone]

## 2018-10-28 ENCOUNTER — Ambulatory Visit (INDEPENDENT_AMBULATORY_CARE_PROVIDER_SITE_OTHER): Payer: BLUE CROSS/BLUE SHIELD | Admitting: Physician Assistant

## 2018-10-28 ENCOUNTER — Encounter: Payer: Self-pay | Admitting: Physician Assistant

## 2018-10-28 VITALS — BP 134/82 | HR 57 | Ht 63.0 in | Wt 214.0 lb

## 2018-10-28 DIAGNOSIS — I480 Paroxysmal atrial fibrillation: Secondary | ICD-10-CM | POA: Diagnosis not present

## 2018-10-28 DIAGNOSIS — R002 Palpitations: Secondary | ICD-10-CM

## 2018-10-28 DIAGNOSIS — R55 Syncope and collapse: Secondary | ICD-10-CM | POA: Diagnosis not present

## 2018-10-28 DIAGNOSIS — R001 Bradycardia, unspecified: Secondary | ICD-10-CM | POA: Diagnosis not present

## 2018-10-28 DIAGNOSIS — D649 Anemia, unspecified: Secondary | ICD-10-CM

## 2018-10-28 DIAGNOSIS — I10 Essential (primary) hypertension: Secondary | ICD-10-CM

## 2018-10-28 MED ORDER — METOPROLOL TARTRATE 25 MG PO TABS: 25.0000 mg | ORAL_TABLET | Freq: Two times a day (BID) | ORAL | 3 refills | Status: DC

## 2018-10-28 NOTE — Patient Instructions (Signed)
Medication Instructions:  DECREASE the Metoprolol Tartrate to 25 mg twice daily  If you need a refill on your cardiac medications before your next appointment, please call your pharmacy.   Lab work: None ordered  Testing/Procedures: Your physician has requested that you have an echocardiogram. Echocardiography is a painless test that uses sound waves to create images of your heart. It provides your doctor with information about the size and shape of your heart and how well your heart's chambers and valves are working. You may receive an ultrasound enhancing agent through an IV if needed to better visualize your heart during the echo.This procedure takes approximately one hour. There are no restrictions for this procedure. This will take place at the [facility].   Your physician has recommended that you wear a 14 day [employer] event monitor. Event monitors are medical devices that record the heart's electrical activity. Doctors most often us these monitors to diagnose arrhythmias. Arrhythmias are problems with the speed or rhythm of the heartbeat. The monitor is a small, portable device. You can wear one while you do your normal daily activities. This is usually used to diagnose what is causing palpitations/syncope (passing out). The Z[employer] company will call you in a few days and mail this to you.   Follow-Up: At [facility], you and your health needs are our priority.  As part of our continuing mission to provide you with exceptional heart care, we have created designated Provider Care Teams.  These Care Teams include your primary Cardiologist (physician) and Advanced Practice Providers (APPs -  Physician Assistants and Nurse Practitioners) who all work together to provide you with the care you need, when you need it. You will need a follow up appointment in 6 weeks with one of our providers.

## 2018-10-29 ENCOUNTER — Telehealth: Payer: Self-pay | Admitting: Physician Assistant

## 2018-10-29 NOTE — Telephone Encounter (Signed)
Cindy Payne is not covered by her insurance. She was offered by them for Finical assistance. She is calling needing to know will this effect her receiving a bill from us  Please advise

## 2018-10-30 ENCOUNTER — Telehealth: Payer: Self-pay | Admitting: *Deleted

## 2018-10-30 NOTE — Telephone Encounter (Signed)
Spoke with patient and advised her the monitor needs precert and then will be covered.  I started precert and should have an answer in 3 business days.  The patient stated that [employer] called her and stated she meets financial assistance and possibly get the monitor for free.  I called Onnie BoerJennifer Clark, RN who is going to call [employer] and get some more info.  The patient is aware that one of us will give her a call back ASAP for further instructions.

## 2018-10-30 NOTE — Telephone Encounter (Signed)
Called [employer] to discuss coverage of monitor with them regarding their patient assistance program and filing with insurance or not. Per Pre-cert department with [facility], patient states insurance would not cover the monitor but she qualified for their program. Per insurance, monitor should be approved and patient would need to pay 20% as she has not met her deductible.  Someone at [facility] is supposed to call me back to discuss this and if prior auth with insurance is still needed and what steps to take next.

## 2018-10-30 NOTE — Telephone Encounter (Signed)
Received message from Delrae Alfredshley Wertz in pre-cert department. She was able to speak with the [employer] rep about this situation. Nothing further needed at this time if someone from [employer] calls back. Thanks!

## 2018-11-03 ENCOUNTER — Ambulatory Visit (INDEPENDENT_AMBULATORY_CARE_PROVIDER_SITE_OTHER): Payer: BLUE CROSS/BLUE SHIELD

## 2018-11-03 DIAGNOSIS — I480 Paroxysmal atrial fibrillation: Secondary | ICD-10-CM | POA: Diagnosis not present

## 2018-11-03 DIAGNOSIS — R002 Palpitations: Secondary | ICD-10-CM | POA: Diagnosis not present

## 2018-11-03 DIAGNOSIS — R55 Syncope and collapse: Secondary | ICD-10-CM

## 2018-11-23 ENCOUNTER — Other Ambulatory Visit: Payer: Self-pay | Admitting: Otolaryngology

## 2018-11-23 DIAGNOSIS — H9311 Tinnitus, right ear: Secondary | ICD-10-CM

## 2018-11-30 ENCOUNTER — Telehealth: Payer: Self-pay | Admitting: *Deleted

## 2018-11-30 NOTE — Telephone Encounter (Signed)
Results called to pt. Pt verbalized understanding.  

## 2018-11-30 NOTE — Telephone Encounter (Signed)
Left a message for the patient to call back.  

## 2018-11-30 NOTE — Telephone Encounter (Signed)
Patient calling to discuss recent testing results  ° °Please call  ° °

## 2018-11-30 NOTE — Telephone Encounter (Signed)
-----   Message from Sondra Bargesyan M Dunn, PA-C sent at 11/29/2018  1:33 PM EST ----- Cardiac monitor showed NSR with an average heart rate of 73 bpm. Isolated PACs, atrial couplets, PVCs, and ventricular couplets. Patient triggered events were not associated with significant arrhythmia. Reassuring study.

## 2018-12-01 ENCOUNTER — Ambulatory Visit
Admission: RE | Admit: 2018-12-01 | Discharge: 2018-12-01 | Disposition: A | Discharge status: Home / Self Care | Payer: BLUE CROSS/BLUE SHIELD | Source: Ambulatory Visit | Attending: Otolaryngology | Admitting: Otolaryngology

## 2018-12-01 DIAGNOSIS — H9311 Tinnitus, right ear: Secondary | ICD-10-CM | POA: Diagnosis not present

## 2018-12-01 DIAGNOSIS — I672 Cerebral atherosclerosis: Secondary | ICD-10-CM | POA: Insufficient documentation

## 2018-12-01 DIAGNOSIS — I708 Atherosclerosis of other arteries: Secondary | ICD-10-CM | POA: Insufficient documentation

## 2018-12-01 DIAGNOSIS — I651 Occlusion and stenosis of basilar artery: Secondary | ICD-10-CM | POA: Insufficient documentation

## 2018-12-01 LAB — POCT I-STAT CREATININE: Creatinine, Ser: 0.6 mg/dL (ref 0.44–1.00)

## 2018-12-01 MED ORDER — GADOBUTROL 1 MMOL/ML IV SOLN
10.0000 mL | Freq: Once | INTRAVENOUS | Status: AC | PRN
  Administered 2018-12-01: 10 mL via INTRAVENOUS

## 2018-12-12 ENCOUNTER — Ambulatory Visit: Payer: BLUE CROSS/BLUE SHIELD

## 2018-12-13 NOTE — Progress Notes (Signed)
Cardiology Office Note  Date:  12/14/2018   ID:  Cindy Payne, DOB Apr 24, 1976, MRN [id number]  PCP:  Leanna SatoMiles, Linda M, MD   Chief Complaint  Patient presents with  . other    F/u echo/zio monitor pt would like to discuss MRI results. Meds reviewed verbally with pt.    HPI:  Cindy Payne is a 43 y.o. female with history of  PAF on Xarelto,  hypertension,  bowel obstruction,  morbid obesity status post gastric bypass surgery  Syncope 01/2017, atrial fib, [place] [employer] who presents for f/u of her palpitations and presyncope.   Discussed prior history with her in detail Episode of atrial fibrillation February 2018, lone episode Presented after drinking, noted to normal sinus rhythm, stayed overnight in the hospital Echocardiogram normal at that time Follow-up 30-day event monitor with no atrial fibrillation She was started on anticoagulation presumably for history of chads vasc2 Hypertension and female No complications on Xarelto  Wonders if blood pressure runs low at times Recent episode of near syncope August 2019, improved with IV fluids  Work-up including normal echocardiogram November 2019, results reviewed with her 2-week monitor showing normal sinus rhythm no significant arrhythmia, rare ectopy  Recent MRI brain showing mild athero-, very small aneurysm Scheduled to see neurosurgery tomorrow  EKG personally reviewed by myself on todays visit Shows normal sinus rhythm with rate 57 bpm no significant ST or T wave changes   Other prior history  admitted to [facility] with a large diaphragmatic hernia,open resection of small bowel segment secondary to obstruction     Postoperative leukocytosis, shortness of breath, and left-sided pleural effusion.    Echocardiogram  showed an EF of 65 to 70%, normal RV size and systolic function, normal bilateral atrial size,   left-sided thoracentesis.     admitted in 01/2017 with syncope when standing follow up 3  alcoholic beverages.   EKG during that admission showed Afib with RVR. She was anticoagulated with Xarelto and spontaneously converted to sinus  Carotid artery ultrasound showed less than 50% bilateral ICA stenosis.  cardiac monitoring showed a baseline sinus rhythm  No significant arrhythmia was noted.    with cardiology in 01/2017 she noted she was  started on metoprolol for elevated heart rates.  She was noted to be in sinus bradycardia with a heart rate of 51 bpm in hospital follow-up.    She was previously followed by Dr. Alvino ChapelIngal though has been lost to follow-up since her initial visit on 01/30/2017.  She was seen in the ED on 07/19/2018 following presyncopal episode.    ED notes indicated the patient was outside talking to someone and standing for approximately 15 minutes when she be began to feel warm and hearing an echo sound in her ears followed by near syncope.  She was able to get down to the ground without loss of consciousness.  Upon attempting to stand back up she again felt dizzy and had associated near syncope.    EKG in the ED demonstrated sinus rhythm with a heart rate of 65 bpm.   given IV fluids with improvement in symptoms   She noted an episode of tachypalpitations with associated dizziness and presyncope on 10/22/2018 while walking through the dollar store.   lasted for several minutes.   taking Lopressor 50 mg twice daily, we will update our MAR.  Patient is unsure which medication she is actually taking.  She denies any falls since she was last seen, BRBPR or melena.  She  does note heavy menses since starting Xarelto.  PMH:   has a past medical history of Hypertension, Morbid obesity (HCC), and PAF (paroxysmal atrial fibrillation) (HCC).  PSH:    Past Surgical History:  Procedure Laterality Date  . CHOLECYSTECTOMY    . GASTRIC BYPASS    . TUBAL LIGATION      Current Outpatient Medications  Medication Sig Dispense Refill  . amLODipine (NORVASC) 5 MG tablet  Take 5 mg by mouth daily.    . Cyanocobalamin (B-12) 500 MCG TABS Take 500 mcg by mouth daily.    . metoprolol tartrate (LOPRESSOR) 25 MG tablet Take 1 tablet (25 mg total) by mouth 2 (two) times daily. 180 tablet 3  . Multiple Vitamin (MULTIVITAMIN) tablet Take 1 tablet by mouth daily.    . rivaroxaban (XARELTO) 20 MG TABS tablet Take 1 tablet (20 mg total) by mouth daily with supper. 30 tablet 0   No current facility-administered medications for this visit.      Allergies:   Septra [sulfamethoxazole-trimethoprim]   Social History:  The patient  reports that she has never smoked. She has never used smokeless tobacco. She reports current alcohol use. She reports that she does not use drugs.   Family History:   family history includes Congestive Heart Failure in her mother; Dementia in her maternal grandmother; Hypertension in her mother and sister.    Review of Systems: Review of Systems  Constitutional: Negative.   Respiratory: Negative.   Cardiovascular: Negative.   Gastrointestinal: Negative.   Musculoskeletal: Negative.   Neurological: Negative.   Psychiatric/Behavioral: Negative.   All other systems reviewed and are negative.    PHYSICAL EXAM: VS:  BP 118/74 (BP Location: Left Arm, Patient Position: Sitting, Cuff Size: Normal)   Pulse (!) 57   Ht 5\' 3"  (1.6 m)   Wt 214 lb (97.1 kg)   BMI 37.91 kg/m  , BMI Body mass index is 37.91 kg/m. GEN: Well nourished, well developed, in no acute distress  HEENT: normal  Neck: no JVD, carotid bruits, or masses Cardiac: RRR; no murmurs, rubs, or gallops,no edema  Respiratory:  clear to auscultation bilaterally, normal work of breathing GI: soft, nontender, nondistended, + BS MS: no deformity or atrophy  Skin: warm and dry, no rash Neuro:  Strength and sensation are intact Psych: euthymic mood, full affect   Recent Labs: 07/19/2018: BUN 11; Hemoglobin 11.7; Platelets 251; Potassium 3.7; Sodium 139 12/01/2018: Creatinine, Ser  0.60    Lipid Panel Lab Results  Component Value Date   CHOL 82 01/26/2017   HDL 39 (L) 01/26/2017   LDLCALC 35 01/26/2017   TRIG 42 01/26/2017      Wt Readings from Last 3 Encounters:  12/14/18 214 lb (97.1 kg)  10/28/18 214 lb (97.1 kg)  07/19/18 208 lb (94.3 kg)       ASSESSMENT AND PLAN:  Paroxysmal atrial fibrillation Lone episode February 2018 with no documentation of arrhythmia since that time.  Diagnosis of hypertension is unclear Recent episode of near syncope, possibly from low blood pressure Recommend she try to wean off amlodipine She will continue very low-dose metoprolol 12.5 twice daily for now, at a later date could change to metoprolol succinate daily If blood pressure continues to run low off amlodipine, CHADS VASC would calculated at 1 (female) Potentially could consider coming off the Xarelto   Pre-syncope As below will try to wean off amlodipine  Sinus bradycardia - Plan: EKG 12-Lead Only tolerating very low-dose metoprolol tartrate 12.5 twice  daily  Essential hypertension Recommend she check orthostatics at home or at work We will try to wean off amlodipine  Syncope, unspecified syncope type Prior episodes concerning for hypotension Episode February 2018, also with episode September 2019 Recommend she try to wean off amlodipine  Cerebral stenoses Documented on MRI scan, scheduled to see neurosurgery tomorrow Also with very small aneurysm Suspect medical management In terms of her risk factors she is nondiabetic, non-smoker, cholesterol is very low   Disposition:   F/U 1 year She will call us with blood pressure measurements We will try to wean off amlodipine and potentially Xarelto   Total encounter time more than 25 minutes  Greater than 50% was spent in counseling and coordination of care with the patient    Orders Placed This Encounter  Procedures  . EKG 12-Lead     Signed, Dossie Arbourim , M.D., Ph.D. 12/14/2018  [facility] [place] [phone]

## 2018-12-14 ENCOUNTER — Encounter: Payer: Self-pay | Admitting: Cardiovascular Disease

## 2018-12-14 ENCOUNTER — Ambulatory Visit (INDEPENDENT_AMBULATORY_CARE_PROVIDER_SITE_OTHER): Payer: Managed Care, Other (non HMO) | Admitting: Cardiovascular Disease

## 2018-12-14 VITALS — BP 118/74 | HR 57 | Ht 63.0 in | Wt 214.0 lb

## 2018-12-14 DIAGNOSIS — R55 Syncope and collapse: Secondary | ICD-10-CM | POA: Diagnosis not present

## 2018-12-14 DIAGNOSIS — I4891 Unspecified atrial fibrillation: Secondary | ICD-10-CM | POA: Diagnosis not present

## 2018-12-14 DIAGNOSIS — I10 Essential (primary) hypertension: Secondary | ICD-10-CM | POA: Diagnosis not present

## 2018-12-14 DIAGNOSIS — R001 Bradycardia, unspecified: Secondary | ICD-10-CM

## 2018-12-14 NOTE — Patient Instructions (Addendum)
Medication Instructions:  No changes  Check pressures standing For a big drop we might need to stop amlodipine  If you need a refill on your cardiac medications before your next appointment, please call your pharmacy.    Lab work: No new labs needed   If you have labs (blood work) drawn today and your tests are completely normal, you will receive your results only by: Marland Kitchen MyChart Message (if you have MyChart) OR . A paper copy in the mail If you have any lab test that is abnormal or we need to change your treatment, we will call you to review the results.   Testing/Procedures: No new testing needed   Follow-Up: At [facility], you and your health needs are our priority.  As part of our continuing mission to provide you with exceptional heart care, we have created designated Provider Care Teams.  These Care Teams include your primary Cardiologist (physician) and Advanced Practice Providers (APPs -  Physician Assistants and Nurse Practitioners) who all work together to provide you with the care you need, when you need it.  . You will need a follow up appointment in 12 months .   Please call our office 2 months in advance to schedule this appointment.    . Providers on your designated Care Team:   . Nicolasa Ducking, NP . Eula Listen, PA-C . Marisue Ivan, PA-C  Any Other Special Instructions Will Be Listed Below (If Applicable).  For educational health videos Log in to : [email or web address] Or : [email or web address], password : [email or web address]

## 2020-01-03 ENCOUNTER — Telehealth: Payer: Self-pay | Admitting: Cardiovascular Disease

## 2020-01-03 NOTE — Telephone Encounter (Signed)
Left voicemail message to call back  

## 2020-01-03 NOTE — Progress Notes (Deleted)
Cardiology Office Note    Date:  01/03/2020   ID:  Cindy Payne, DOB 09/07/1976, MRN [id number]  PCP:  Leanna Sato, MD  Cardiologist:  Julien Nordmann, MD  Electrophysiologist:  None   Chief Complaint: Follow-up  History of Present Illness:   Cindy Payne is a 44 y.o. 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 Fib.  She was previous admitted to [facility] with a large diaphragmatic hernia as well as an open resection of small bowel segment secondary to obstruction and underwent surgical repair.  Postoperative course was complicated by persistent leukocytosis, S OB, and left-sided pleural effusion.  Echo at that time showed an EF of 65 to 70%, normal RV systolic function and cavity size, normal biatrial size, and no significant valvular disease.  She subsequently underwent successful left-sided thoracentesis.  She was admitted to the hospital in 01/2017 with syncope when standing following 3 alcoholic beverages.  EKG during that admission showed new onset A. fib with RVR.  She was anticoagulated with Xarelto and converted to sinus rhythm prior to discharge.  Echo at that time showed an EF of 60 to 65%, no regional wall motion abnormalities, normal LV diastolic function, left atrium normal in size, normal RV systolic function, PASP normal.  Carotid artery ultrasound showed less than 50% bilateral ICA stenosis.  Outpatient cardiac monitoring showed predominant rhythm of sinus with an average heart rate of 64 bpm and no significant arrhythmias.  Patient detected episodes corresponded to sinus rhythm and sinus arrhythmia.  She was seen in the ED in 07/2018 with presyncope and treated with IV fluids.  She again had an episode of dizziness/presyncope in 10/2018 with subsequent 2-week ZIO showing  normal sinus rhythm, average heart rate 73 bpm, no significant sustained arrhythmias with rare PACs and PVCs.  Echo in 10/2018 showed an EF of 60 to 65%, no regional wall motion abnormalities, normal RV systolic function and cavity size, mild tricuspid regurgitation.  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  In this setting, she was seen by neurosurgery with recommendation to follow-up with neurovascular with subsequent CTA of the head in 12/2018 showing no evidence of aberrant course of ICA or aVF to explain pulsatile tenderness as well as no evidence of arterial occlusion or aneurysms with the previously described left PCA outpouching appearing to be related to an infundibulum.  She was last seen by her primary cardiologist on 12/14/2018 with recommendation to wean off amlodipine as her near syncope was felt to possibly related to low BP.  There was some question as to if the patient formally had a diagnosis of hypertension and in this setting if it was felt she did not, her primary cardiologist felt she may be able to come off Xarelto.  She contacted our office on 01/03/2020 noting a heart rate of 122 bpm and that she "feels funny, body feels tingly."  In this setting, appointment was made for today.  ***   Labs independently reviewed: 07/2018 - Hgb 11.7, PLT 251, potassium 3.7, BUN 11, serum creatinine  0.76 01/2017 - TC 82, TG 42, HDL 39, LDL 35, A1c 5.2, TSH normal, magnesium 1.9, albumin 3.9, AST/ALT normal  Past Medical History:  Diagnosis Date  . Hypertension   . Morbid obesity (HCC)    a. status post gastric bypass  . PAF (paroxysmal atrial fibrillation) (HCC)     a.  Diagnosed 2/18; b. CHADS2VASc 2 (HTN, female)    Past Surgical History:  Procedure Laterality Date  . CHOLECYSTECTOMY    . GASTRIC BYPASS    . TUBAL LIGATION      Current Medications: No outpatient medications have been marked as taking for the 01/07/20 encounter (Appointment) with Sondra Barges, PA-C.    Allergies:   Septra [sulfamethoxazole-trimethoprim]   Social History   Socioeconomic History  . Marital status: Single    Spouse name: Not on file  . Number of children: Not on file  . Years of education: Not on file  . Highest education level: Not on file  Occupational History  . Not on file  Tobacco Use  . Smoking status: Never Smoker  . Smokeless tobacco: Never Used  Substance and Sexual Activity  . Alcohol use: Yes  . Drug use: No  . Sexual activity: Not on file  Other Topics Concern  . Not on file  Social History Narrative  . Not on file   Social Determinants of Health   Financial Resource Strain:   . Difficulty of Paying Living Expenses: Not on file  Food Insecurity:   . Worried About Programme researcher, broadcasting/film/video in the Last Year: Not on file  . Ran Out of Food in the Last Year: Not on file  Transportation Needs:   . Lack of Transportation (Medical): Not on file  . Lack of Transportation (Non-Medical): Not on file  Physical Activity:   . Days of Exercise per Week: Not on file  . Minutes of Exercise per Session: Not on file  Stress:   . Feeling of Stress : Not on file  Social Connections:   . Frequency of Communication with Friends and Family: Not on file  . Frequency of Social Gatherings with Friends and Family: Not on file  . Attends Religious Services: Not on file  . Active Member of Clubs or Organizations: Not on file  . Attends Banker Meetings: Not on file  . Marital Status: Not on file     Family History:  The patient's family history includes Congestive Heart Failure in her mother; Dementia in her maternal grandmother; Hypertension  in her mother and sister.  ROS:   ROS   EKGs/Labs/Other Studies Reviewed:    Studies reviewed were summarized above. The additional studies were reviewed today: ***  EKG:  EKG is ordered today.  The EKG ordered today demonstrates ***  Recent Labs: No results found for requested labs within last 8760 hours.  Recent Lipid Panel    Component Value Date/Time   CHOL 82 01/26/2017 0621   TRIG 42 01/26/2017 0621   HDL 39 (L) 01/26/2017 0621   CHOLHDL 2.1 01/26/2017 0621   VLDL 8 01/26/2017 0621   LDLCALC 35 01/26/2017 0621    PHYSICAL EXAM:    VS:  There were no vitals taken for this visit.  BMI: There is no height or weight on file to calculate BMI.  Physical Exam  Wt Readings from Last 3 Encounters:  12/14/18 214 lb (97.1 kg)  10/28/18 214 lb (97.1 kg)  07/19/18 208 lb (94.3 kg)  ASSESSMENT & PLAN:   1. ***  Disposition: F/u with Dr. Marland Kitchen or an APP in ***.   Medication Adjustments/Labs and Tests Ordered: Current medicines are reviewed at length with the patient today.  Concerns regarding medicines are outlined above. Medication changes, Labs and Tests ordered today are summarized above and listed in the Patient Instructions accessible in Encounters.   Signed, Christell Faith, PA-C 01/03/2020 1:10 PM     [place] [employer] [place], [place] [address] [phone]

## 2020-01-03 NOTE — Telephone Encounter (Signed)
STAT if HR is under 50 or over 120 (normal HR is 60-100 beats per minute)  1) What is your heart rate? Yesterday 122  2) Do you have a log of your heart rate readings (document readings)?  Today 50, states it has been in the 70s and has gotten down to the 50s  Do you have any other symptoms?  Not sure, just "feels funny, body feels tingly"

## 2020-01-04 NOTE — Telephone Encounter (Signed)
Patient returning call, can be reached at [phone]

## 2020-01-04 NOTE — Telephone Encounter (Signed)
I spoke with the patient.  She reports having some elevated HR's on Sunday evening ~ 122 bpm. 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  She has been taking metoprolol tartrate 50 mg- 1/2 tablet (25 mg) BID. She states she took a full 50 mg of metoprolol Sunday night and her HR settled down shortly thereafter.   She states she was at work yesterday and she started to feel hot & clammy which is not normal for her at work. She is usually cold and had a blanket on her lap at the time she started to feel hot & clammy. She noted her HR to be ~ 88 bpm at that time. She states her HR was intermittently elevated yesterday and she just felt fatigued.  Per the patient, her usual HR is 50-60 bpm. She does not check her BP at home.  She reports feeling ok today. She did have an appt with her PCP yesterday- an EKG was done per the patient and she was in NSR. She can only recall that her DBP was in the 90's.  Her PCP advised her to increase metoprolol tartrate 50 mg- take 1/2 tablet (25 mg) in the AM & 1 tablet (50 mg) in the PM.  The patient confirms she stopped Xarelto shortly after her appt with Dr. Mariah Milling 1 year ago.  I have advised the patient to continue to monitor her symptoms for now. She has a follow up appt scheduled with Eula Listen, PA on 01/07/20.    The patient voices understanding and is agreeable.

## 2020-01-04 NOTE — Telephone Encounter (Signed)
Attempted to call the patient. No answer- I left a message to please call back.  

## 2020-01-07 ENCOUNTER — Ambulatory Visit: Payer: Managed Care, Other (non HMO) | Admitting: Cardiovascular Disease

## 2020-02-07 NOTE — Progress Notes (Deleted)
Cardiology Office Note  Date:  02/07/2020   ID:  Cindy Payne, DOB [birth year], MRN [id number]  PCP:  Leanna Sato, MD   No chief complaint on file.   HPI:  Cindy Payne is a 44 y.o. female with history of  PAF on Xarelto,  hypertension,  bowel obstruction,  morbid obesity status post gastric bypass surgery  Syncope 01/2017, atrial fib, at [place] who presents for f/u of her palpitations and presyncope.   Discussed prior history with her in detail Episode of atrial fibrillation February 2018, lone episode Presented after drinking, noted to normal sinus rhythm, stayed overnight in the hospital Echocardiogram normal at that time Follow-up 30-day event monitor with no atrial fibrillation She was started on anticoagulation presumably for history of chads vasc2 Hypertension and female No complications on Xarelto  Wonders if blood pressure runs low at times Recent episode of near syncope August 2019, improved with IV fluids  Work-up including normal echocardiogram November 2019, results reviewed with her 2-week monitor showing normal sinus rhythm no significant arrhythmia, rare ectopy  Recent MRI brain showing mild athero-, very small aneurysm Scheduled to see neurosurgery tomorrow  EKG personally reviewed by myself on todays visit Shows normal sinus rhythm with rate 57 bpm no significant ST or T wave changes   Other prior history  admitted to [facility] with a large diaphragmatic hernia,open resection of small bowel segment secondary to obstruction     Postoperative leukocytosis, shortness of breath, and left-sided pleural effusion.    Echocardiogram  showed an EF of 65 to 70%, normal RV size and systolic function, normal bilateral atrial size,   left-sided thoracentesis.     admitted in 01/2017 with syncope when standing follow up 3 alcoholic beverages.   EKG during that admission showed Afib with RVR. She was anticoagulated with Xarelto and  spontaneously converted to sinus  Carotid artery ultrasound showed less than 50% bilateral ICA stenosis.  cardiac monitoring showed a baseline sinus rhythm  No significant arrhythmia was noted.    with cardiology in 01/2017 she noted she was  started on metoprolol for elevated heart rates.  She was noted to be in sinus bradycardia with a heart rate of 51 bpm in hospital follow-up.    She was previously followed by Dr. Alvino Chapel though has been lost to follow-up since her initial visit on 01/30/2017.  She was seen in the ED on 07/19/2018 following presyncopal episode.    ED notes indicated the patient was outside talking to someone and standing for approximately 15 minutes when she be began to feel warm and hearing an echo sound in her ears followed by near syncope.  She was able to get down to the ground without loss of consciousness.  Upon attempting to stand back up she again felt dizzy and had associated near syncope.    EKG in the ED demonstrated sinus rhythm with a heart rate of 65 bpm.   given IV fluids with improvement in symptoms   She noted an episode of tachypalpitations with associated dizziness and presyncope on 10/22/2018 while walking through the dollar store.   lasted for several minutes.   taking Lopressor 50 mg twice daily, we will update our MAR.  Patient is unsure which medication she is actually taking.  She denies any falls since she was last seen, BRBPR or melena.  She does note heavy menses since starting Xarelto.  PMH:   has a past medical history of Hypertension, Morbid obesity (HCC), and PAF (  paroxysmal atrial fibrillation) ([place]).  PSH:    Past Surgical History:  Procedure Laterality Date  . CHOLECYSTECTOMY    . GASTRIC BYPASS    . TUBAL LIGATION      Current Outpatient Medications  Medication Sig Dispense Refill  . amLODipine (NORVASC) 5 MG tablet Take 5 mg by mouth daily.    . Cyanocobalamin (B-12) 500 MCG TABS Take 500 mcg by mouth daily.    . metoprolol  tartrate (LOPRESSOR) 50 MG tablet Take 1/2 tablet (25 mg) by mouth in the morning & 1 tablet (50 mg) by mouth in the evening    . Multiple Vitamin (MULTIVITAMIN) tablet Take 1 tablet by mouth daily.     No current facility-administered medications for this visit.     Allergies:   Septra [sulfamethoxazole-trimethoprim]   Social History:  The patient  reports that she has never smoked. She has never used smokeless tobacco. She reports current alcohol use. She reports that she does not use drugs.   Family History:   family history includes Congestive Heart Failure in her mother; Dementia in her maternal grandmother; Hypertension in her mother and sister.    Review of Systems: Review of Systems  Constitutional: Negative.   Respiratory: Negative.   Cardiovascular: Negative.   Gastrointestinal: Negative.   Musculoskeletal: Negative.   Neurological: Negative.   Psychiatric/Behavioral: Negative.   All other systems reviewed and are negative.    PHYSICAL EXAM: VS:  There were no vitals taken for this visit. , BMI There is no height or weight on file to calculate BMI. GEN: Well nourished, well developed, in no acute distress  HEENT: normal  Neck: no JVD, carotid bruits, or masses Cardiac: RRR; no murmurs, rubs, or gallops,no edema  Respiratory:  clear to auscultation bilaterally, normal work of breathing GI: soft, nontender, nondistended, + BS MS: no deformity or atrophy  Skin: warm and dry, no rash Neuro:  Strength and sensation are intact Psych: euthymic mood, full affect   Recent Labs: No results found for requested labs within last 8760 hours.    Lipid Panel Lab Results  Component Value Date   CHOL 82 01/26/2017   HDL 39 (L) 01/26/2017   LDLCALC 35 01/26/2017   TRIG 42 01/26/2017      Wt Readings from Last 3 Encounters:  12/14/18 214 lb (97.1 kg)  10/28/18 214 lb (97.1 kg)  07/19/18 208 lb (94.3 kg)       ASSESSMENT AND PLAN:  Paroxysmal atrial  fibrillation Lone episode February 2018 with no documentation of arrhythmia since that time.  Diagnosis of hypertension is unclear Recent episode of near syncope, possibly from low blood pressure Recommend she try to wean off amlodipine She will continue very low-dose metoprolol 12.5 twice daily for now, at a later date could change to metoprolol succinate daily If blood pressure continues to run low off amlodipine, CHADS VASC would calculated at 1 (female) Potentially could consider coming off the Xarelto   Pre-syncope As below will try to wean off amlodipine  Sinus bradycardia - Plan: EKG 12-Lead Only tolerating very low-dose metoprolol tartrate 12.5 twice daily  Essential hypertension Recommend she check orthostatics at home or at work We will try to wean off amlodipine  Syncope, unspecified syncope type Prior episodes concerning for hypotension Episode February 2018, also with episode September 2019 Recommend she try to wean off amlodipine  Cerebral stenoses Documented on MRI scan, scheduled to see neurosurgery tomorrow Also with very small aneurysm Suspect medical management In terms of  her risk factors she is nondiabetic, non-smoker, cholesterol is very low   Disposition:   F/U 1 year She will call us with blood pressure measurements We will try to wean off amlodipine and potentially Xarelto   Total encounter time more than 25 minutes  Greater than 50% was spent in counseling and coordination of care with the patient    No orders of the defined types were placed in this encounter.    Signed, Dossie Arbour, M.D., Ph.D. 02/07/2020  [facility] [place], [place] [phone]

## 2020-02-08 ENCOUNTER — Ambulatory Visit: Payer: Managed Care, Other (non HMO) | Admitting: Cardiovascular Disease

## 2020-02-13 NOTE — Progress Notes (Deleted)
Cardiology Office Note  Date:  02/13/2020   ID:  JOYLYNN DEFRANCESCO, DOB 08-12-1976, MRN [id number]  PCP:  Leanna Sato, MD   No chief complaint on file.   HPI:  Cindy Payne is a 44 y.o. female with history of  PAF on Xarelto,  hypertension,  bowel obstruction,  morbid obesity status post gastric bypass surgery  Syncope 01/2017, atrial fib, at [place] who presents for f/u of her palpitations and presyncope.   Discussed prior history with her in detail Episode of atrial fibrillation February 2018, lone episode Presented after drinking, noted to normal sinus rhythm, stayed overnight in the hospital Echocardiogram normal at that time Follow-up 30-day event monitor with no atrial fibrillation She was started on anticoagulation presumably for history of chads vasc2 Hypertension and female No complications on Xarelto  Wonders if blood pressure runs low at times Recent episode of near syncope August 2019, improved with IV fluids  Work-up including normal echocardiogram November 2019, results reviewed with her 2-week monitor showing normal sinus rhythm no significant arrhythmia, rare ectopy  Recent MRI brain showing mild athero-, very small aneurysm Scheduled to see neurosurgery tomorrow  EKG personally reviewed by myself on todays visit Shows normal sinus rhythm with rate 57 bpm no significant ST or T wave changes   Other prior history  admitted to [facility] with a large diaphragmatic hernia,open resection of small bowel segment secondary to obstruction     Postoperative leukocytosis, shortness of breath, and left-sided pleural effusion.    Echocardiogram  showed an EF of 65 to 70%, normal RV size and systolic function, normal bilateral atrial size,   left-sided thoracentesis.     admitted in 01/2017 with syncope when standing follow up 3 alcoholic beverages.   EKG during that admission showed Afib with RVR. She was anticoagulated with Xarelto and  spontaneously converted to sinus  Carotid artery ultrasound showed less than 50% bilateral ICA stenosis.  cardiac monitoring showed a baseline sinus rhythm  No significant arrhythmia was noted.    with cardiology in 01/2017 she noted she was  started on metoprolol for elevated heart rates.  She was noted to be in sinus bradycardia with a heart rate of 51 bpm in hospital follow-up.    She was previously followed by Dr. Alvino Chapel though has been lost to follow-up since her initial visit on 01/30/2017.  She was seen in the ED on 07/19/2018 following presyncopal episode.    ED notes indicated the patient was outside talking to someone and standing for approximately 15 minutes when she be began to feel warm and hearing an echo sound in her ears followed by near syncope.  She was able to get down to the ground without loss of consciousness.  Upon attempting to stand back up she again felt dizzy and had associated near syncope.    EKG in the ED demonstrated sinus rhythm with a heart rate of 65 bpm.   given IV fluids with improvement in symptoms   She noted an episode of tachypalpitations with associated dizziness and presyncope on 10/22/2018 while walking through the dollar store.   lasted for several minutes.   taking Lopressor 50 mg twice daily, we will update our MAR.  Patient is unsure which medication she is actually taking.  She denies any falls since she was last seen, BRBPR or melena.  She does note heavy menses since starting Xarelto.  PMH:   has a past medical history of Hypertension, Morbid obesity (HCC), and PAF (  paroxysmal atrial fibrillation) ([place]).  PSH:    Past Surgical History:  Procedure Laterality Date  . CHOLECYSTECTOMY    . GASTRIC BYPASS    . TUBAL LIGATION      Current Outpatient Medications  Medication Sig Dispense Refill  . amLODipine (NORVASC) 5 MG tablet Take 5 mg by mouth daily.    . Cyanocobalamin (B-12) 500 MCG TABS Take 500 mcg by mouth daily.    . metoprolol  tartrate (LOPRESSOR) 50 MG tablet Take 1/2 tablet (25 mg) by mouth in the morning & 1 tablet (50 mg) by mouth in the evening    . Multiple Vitamin (MULTIVITAMIN) tablet Take 1 tablet by mouth daily.     No current facility-administered medications for this visit.     Allergies:   Septra [sulfamethoxazole-trimethoprim]   Social History:  The patient  reports that she has never smoked. She has never used smokeless tobacco. She reports current alcohol use. She reports that she does not use drugs.   Family History:   family history includes Congestive Heart Failure in her mother; Dementia in her maternal grandmother; Hypertension in her mother and sister.    Review of Systems: Review of Systems  Constitutional: Negative.   Respiratory: Negative.   Cardiovascular: Negative.   Gastrointestinal: Negative.   Musculoskeletal: Negative.   Neurological: Negative.   Psychiatric/Behavioral: Negative.   All other systems reviewed and are negative.    PHYSICAL EXAM: VS:  There were no vitals taken for this visit. , BMI There is no height or weight on file to calculate BMI. GEN: Well nourished, well developed, in no acute distress  HEENT: normal  Neck: no JVD, carotid bruits, or masses Cardiac: RRR; no murmurs, rubs, or gallops,no edema  Respiratory:  clear to auscultation bilaterally, normal work of breathing GI: soft, nontender, nondistended, + BS MS: no deformity or atrophy  Skin: warm and dry, no rash Neuro:  Strength and sensation are intact Psych: euthymic mood, full affect   Recent Labs: No results found for requested labs within last 8760 hours.    Lipid Panel Lab Results  Component Value Date   CHOL 82 01/26/2017   HDL 39 (L) 01/26/2017   LDLCALC 35 01/26/2017   TRIG 42 01/26/2017      Wt Readings from Last 3 Encounters:  12/14/18 214 lb (97.1 kg)  10/28/18 214 lb (97.1 kg)  07/19/18 208 lb (94.3 kg)       ASSESSMENT AND PLAN:  Paroxysmal atrial  fibrillation Lone episode February 2018 with no documentation of arrhythmia since that time.  Diagnosis of hypertension is unclear Recent episode of near syncope, possibly from low blood pressure Recommend she try to wean off amlodipine She will continue very low-dose metoprolol 12.5 twice daily for now, at a later date could change to metoprolol succinate daily If blood pressure continues to run low off amlodipine, CHADS VASC would calculated at 1 (female) Potentially could consider coming off the Xarelto   Pre-syncope As below will try to wean off amlodipine  Sinus bradycardia - Plan: EKG 12-Lead Only tolerating very low-dose metoprolol tartrate 12.5 twice daily  Essential hypertension Recommend she check orthostatics at home or at work We will try to wean off amlodipine  Syncope, unspecified syncope type Prior episodes concerning for hypotension Episode February 2018, also with episode September 2019 Recommend she try to wean off amlodipine  Cerebral stenoses Documented on MRI scan, scheduled to see neurosurgery tomorrow Also with very small aneurysm Suspect medical management In terms of  her risk factors she is nondiabetic, non-smoker, cholesterol is very low   Disposition:   F/U 1 year She will call us with blood pressure measurements We will try to wean off amlodipine and potentially Xarelto   Total encounter time more than 25 minutes  Greater than 50% was spent in counseling and coordination of care with the patient    No orders of the defined types were placed in this encounter.    Signed, Dossie Arbour, M.D., Ph.D. 02/13/2020  [facility] [place], [place] [phone]

## 2020-02-14 ENCOUNTER — Ambulatory Visit: Payer: Managed Care, Other (non HMO) | Admitting: Cardiovascular Disease

## 2020-03-05 NOTE — Progress Notes (Signed)
Cardiology Office Note  Date:  03/06/2020   ID:  Cindy Payne, DOB November 07, 1976, MRN [id number]  PCP:  Leanna Sato, MD   Chief Complaint  Patient presents with  . other    1 year follow up. Meds reviewed by the pt. verbally. Pt. c/o racing heart beats and feeling fatigue.     HPI:  Cindy Payne is a 44 y.o. female with history of  PAF  hypertension,  bowel obstruction,  Diaphragmatic hernia repair morbid obesity status post gastric bypass surgery  Syncope 01/2017, atrial fib, at [place] who presents for f/u of her palpitations and presyncope.   Reports having episode of tachycardia while she was at work, heart rate 100 up to 115 bpm Presented acutely, terminated abruptly Typical heart rate runs slower, 60s Was able to track to tachycardia using pulse meter on her phone She did appreciate the tachycardia Typically takes metoprolol tartrate 20 5 in the morning 50 at nighttime  Currently not on Xarelto Blood pressure stable  No dizziness/near syncope    Other past medical history reviewed Episode of atrial fibrillation February 2018, lone episode Presented after drinking, noted to normal sinus rhythm, stayed overnight in the hospital Echocardiogram normal at that time Follow-up 30-day event monitor with no atrial fibrillation She was started on anticoagulation presumably for history of chads vasc2 Hypertension and female No complications on Xarelto   near syncope August 2019, improved with IV fluids  Work-up including normal echocardiogram November 2019, results reviewed with her 2-week monitor showing normal sinus rhythm no significant arrhythmia, rare ectopy   MRI brain showing mild athero-, very small aneurysm Scheduled to see neurosurgery tomorrow   admitted to [facility] with a large diaphragmatic hernia,open resection of small bowel segment secondary to obstruction     Postoperative leukocytosis, shortness of breath, and left-sided pleural  effusion.    Echocardiogram  showed an EF of 65 to 70%, normal RV size and systolic function, normal bilateral atrial size,   left-sided thoracentesis.     admitted in 01/2017 with syncope when standing follow up 3 alcoholic beverages.   EKG during that admission showed Afib with RVR. She was anticoagulated with Xarelto and spontaneously converted to sinus  Carotid artery ultrasound showed less than 50% bilateral ICA stenosis.  cardiac monitoring showed a baseline sinus rhythm  No significant arrhythmia was noted.    with cardiology in 01/2017 she noted she was  started on metoprolol for elevated heart rates.  She was noted to be in sinus bradycardia with a heart rate of 51 bpm in hospital follow-up.    She was previously followed by Dr. Alvino Chapel though has been lost to follow-up since her initial visit on 01/30/2017.  She was seen in the ED on 07/19/2018 following presyncopal episode.    ED notes indicated the patient was outside talking to someone and standing for approximately 15 minutes when she be began to feel warm and hearing an echo sound in her ears followed by near syncope.  She was able to get down to the ground without loss of consciousness.  Upon attempting to stand back up she again felt dizzy and had associated near syncope.    EKG in the ED demonstrated sinus rhythm with a heart rate of 65 bpm.   given IV fluids with improvement in symptoms   She noted an episode of tachypalpitations with associated dizziness and presyncope on 10/22/2018 while walking through the [employer] store.   lasted for several minutes.  PMH:   has a past medical history of Hypertension, Morbid obesity ([place]), and PAF (paroxysmal atrial fibrillation) (Cindy Payne).  PSH:    Past Surgical History:  Procedure Laterality Date  . CHOLECYSTECTOMY    . GASTRIC BYPASS    . TUBAL LIGATION      Current Outpatient Medications  Medication Sig Dispense Refill  . amLODipine (NORVASC) 5 MG tablet Take 5 mg by mouth  daily.    . cetirizine (ZYRTEC) 10 MG tablet Take 10 mg by mouth daily.    . Cyanocobalamin (B-12) 500 MCG TABS Take 500 mcg by mouth daily.    . metoprolol tartrate (LOPRESSOR) 50 MG tablet Take 1/2 tablet (25 mg) by mouth in the morning & 1 tablet (50 mg) by mouth in the evening    . Multiple Vitamin (MULTIVITAMIN) tablet Take 1 tablet by mouth daily.     No current facility-administered medications for this visit.     Allergies:   Septra [sulfamethoxazole-trimethoprim]   Social History:  The patient  reports that she has never smoked. She has never used smokeless tobacco. She reports current alcohol use. She reports that she does not use drugs.   Family History:   family history includes Congestive Heart Failure in her mother; Dementia in her maternal grandmother; Hypertension in her mother and sister.    Review of Systems: Review of Systems  Constitutional: Negative.   HENT: Negative.   Respiratory: Negative.   Cardiovascular: Negative.        Tachycardia  Gastrointestinal: Negative.   Musculoskeletal: Negative.   Neurological: Negative.   Psychiatric/Behavioral: Negative.   All other systems reviewed and are negative.    PHYSICAL EXAM: VS:  BP 130/88 (BP Location: Left Arm, Patient Position: Sitting, Cuff Size: Large)   Pulse 64   Ht 5\' 3"  (1.6 m)   Wt 210 lb 6 oz (95.4 kg)   BMI 37.27 kg/m  , BMI Body mass index is 37.27 kg/m. GEN: Well nourished, well developed, in no acute distress  HEENT: normal  Neck: no JVD, carotid bruits, or masses Cardiac: RRR; no murmurs, rubs, or gallops,no edema  Respiratory:  clear to auscultation bilaterally, normal work of breathing GI: soft, nontender, nondistended, + BS MS: no deformity or atrophy  Skin: warm and dry, no rash Neuro:  Strength and sensation are intact Psych: euthymic mood, full affect  Recent Labs: No results found for requested labs within last 8760 hours.    Lipid Panel Lab Results  Component Value Date    CHOL 82 01/26/2017   HDL 39 (L) 01/26/2017   LDLCALC 35 01/26/2017   TRIG 42 01/26/2017      Wt Readings from Last 3 Encounters:  03/06/20 210 lb 6 oz (95.4 kg)  12/14/18 214 lb (97.1 kg)  10/28/18 214 lb (97.1 kg)       ASSESSMENT AND PLAN:  Paroxysmal atrial fibrillation Lone episode February 2018 with no documentation of arrhythmia since that time.  Currently not on anticoagulation  Atrial tachycardia Paroxysmal episode, was symptomatic Recommend she let us know if she continues to have episodes We will suggest she take metoprolol tartrate 50 in the morning, 20 5 in the evening.  Could take extra 25 as needed for breakthrough tachycardia ZIO symptoms get worse  Syncope, unspecified syncope type Prior episodes concerning for hypotension Episode February 2018, also with episode September 2019 For any recurrent symptoms would wean off the amlodipine  Cerebral stenoses Documented on MRI scan, scheduled to see neurosurgery tomorrow Also with  very small aneurysm Nondiabetic, non-smoker, cholesterol under excellent control   Disposition:   F/U 1 year She will call us with blood pressure measurements We will try to wean off amlodipine and potentially Xarelto   Total encounter time more than 25 minutes  Greater than 50% was spent in counseling and coordination of care with the patient    Orders Placed This Encounter  Procedures  . EKG 12-Lead     Signed, Dossie Arbour, M.D., Ph.D. 03/06/2020  [facility] [place], [place] [phone]

## 2020-03-06 ENCOUNTER — Ambulatory Visit (INDEPENDENT_AMBULATORY_CARE_PROVIDER_SITE_OTHER): Payer: Managed Care, Other (non HMO) | Admitting: Cardiovascular Disease

## 2020-03-06 ENCOUNTER — Other Ambulatory Visit: Payer: Self-pay

## 2020-03-06 ENCOUNTER — Encounter: Payer: Self-pay | Admitting: Cardiovascular Disease

## 2020-03-06 VITALS — BP 130/88 | HR 64 | Ht 63.0 in | Wt 210.4 lb

## 2020-03-06 DIAGNOSIS — I471 Supraventricular tachycardia: Secondary | ICD-10-CM

## 2020-03-06 DIAGNOSIS — R001 Bradycardia, unspecified: Secondary | ICD-10-CM

## 2020-03-06 DIAGNOSIS — I10 Essential (primary) hypertension: Secondary | ICD-10-CM

## 2020-03-06 DIAGNOSIS — I480 Paroxysmal atrial fibrillation: Secondary | ICD-10-CM

## 2020-03-06 DIAGNOSIS — R55 Syncope and collapse: Secondary | ICD-10-CM

## 2020-03-06 NOTE — Patient Instructions (Signed)

## 2020-07-11 IMAGING — MR MR MRA HEAD W/O CM
1 series · 17 of 48 positions shown · IV contrast (gadavist)
Comparison: None available.

CLINICAL DATA: Initial evaluation for right-sided

EXAM:
MRI HEAD WITHOUT AND WITH CONTRAST
MRA HEAD WITHOUT CONTRAST
TECHNIQUE: Multiplanar, multiecho pulse sequences of the brain and surrounding
structures were obtained without and with intravenous contrast. An
IAC protocol was utilized. Angiographic images of the head were
obtained using MRA technique without contrast.
CONTRAST:  10 cc of Gadavist.

[Series 1: TOF · axial · 0.5mm · 0.41mm/px · z∈[-43,+54]mm · 17 of 205 slices shown]
[im 1/205]
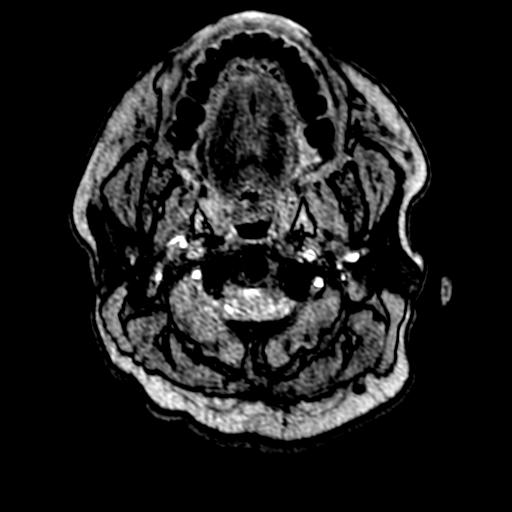
[im 5/205]
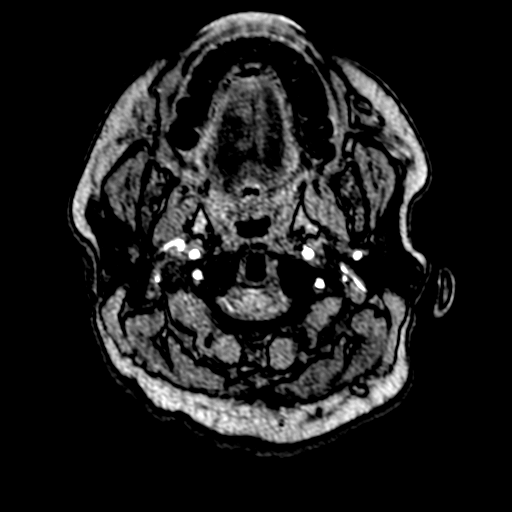
[im 9/205]
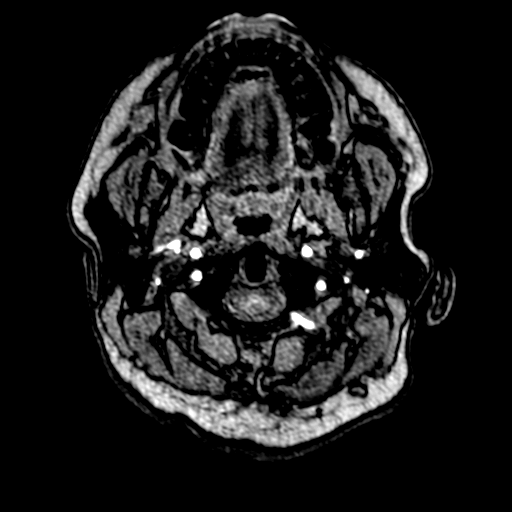
[im 14/205]
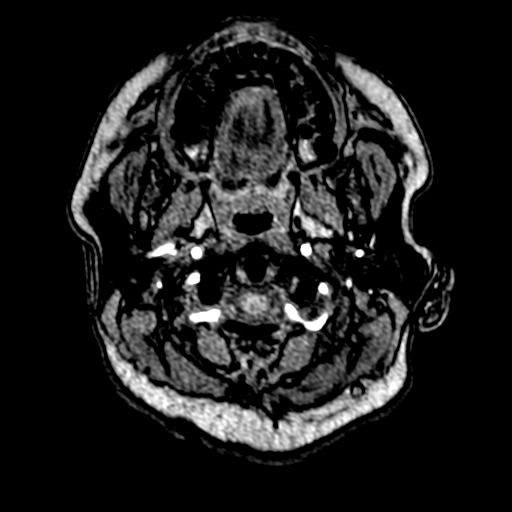
[im 18/205]
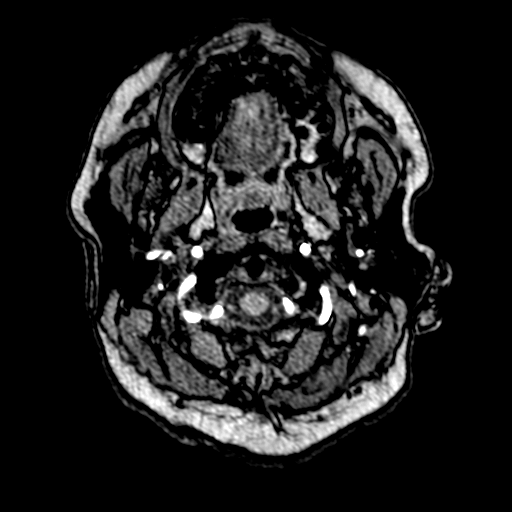
[im 22/205]
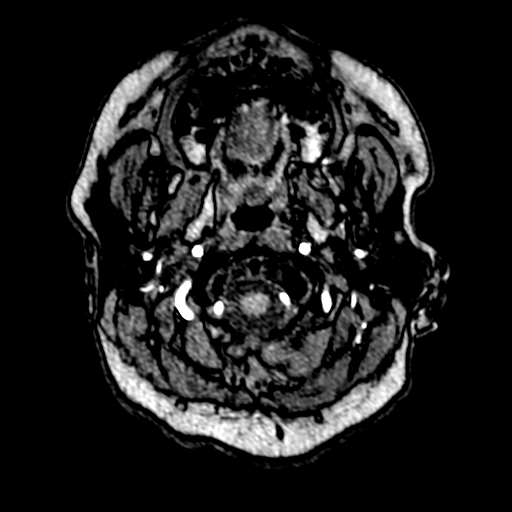
[im 27/205]
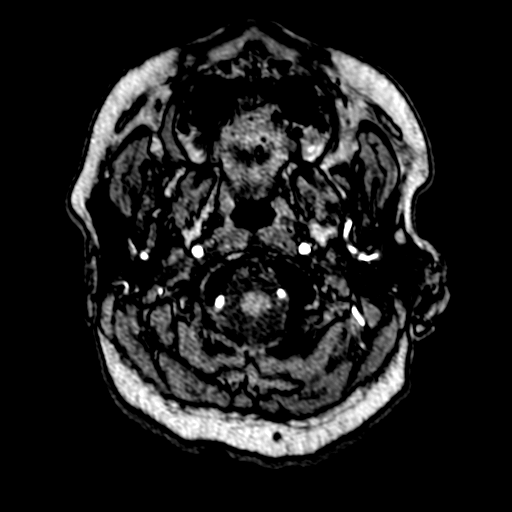
[im 35/205]
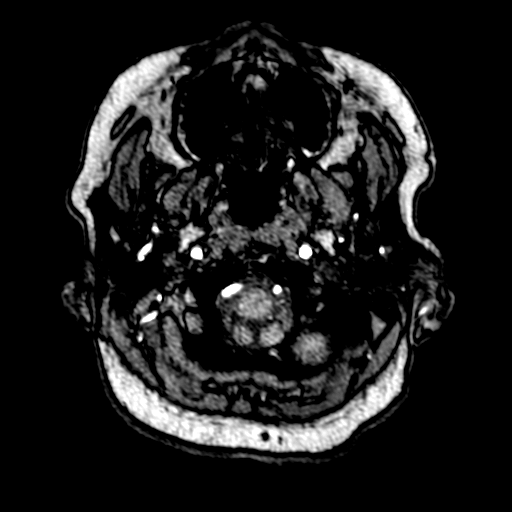
[im 40/205]
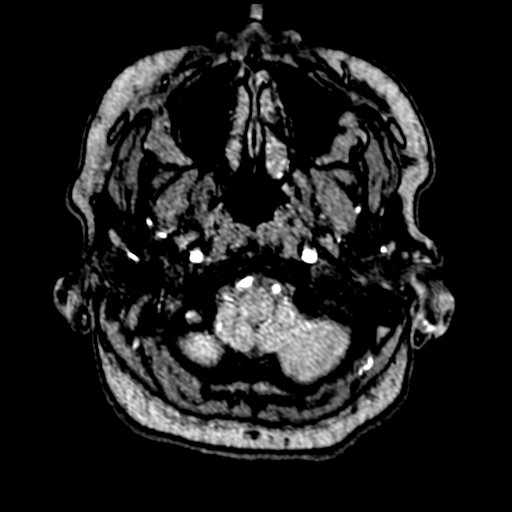
[im 66/205]
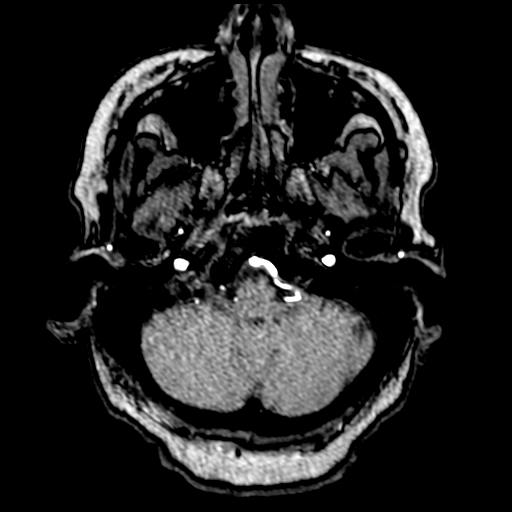
[im 92/205]
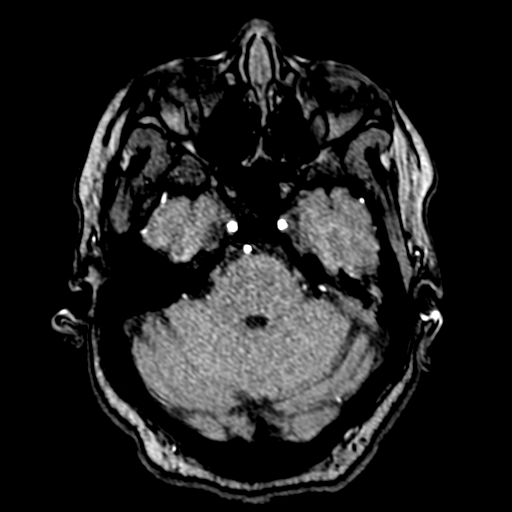
[im 105/205]
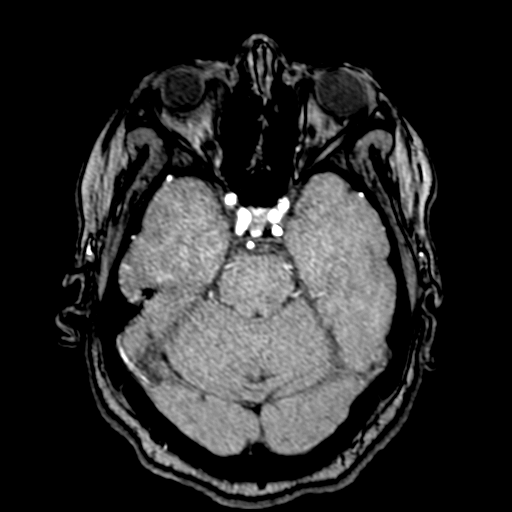
[im 118/205]
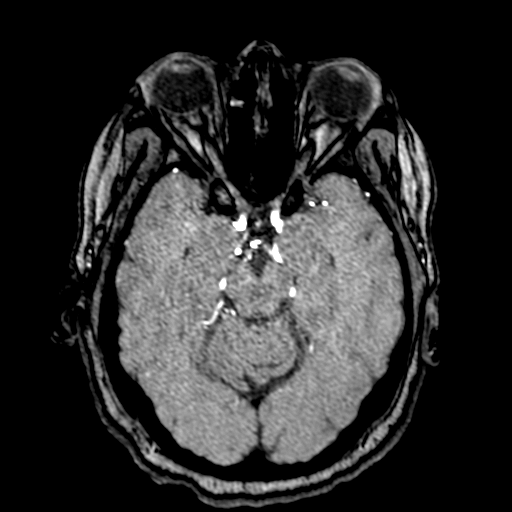
[im 144/205]
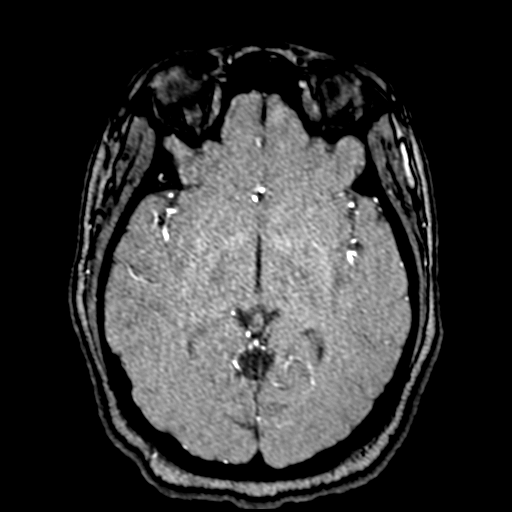
[im 170/205]
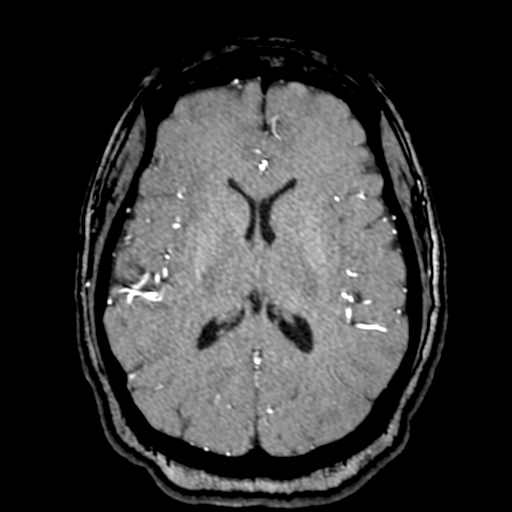
[im 174/205]
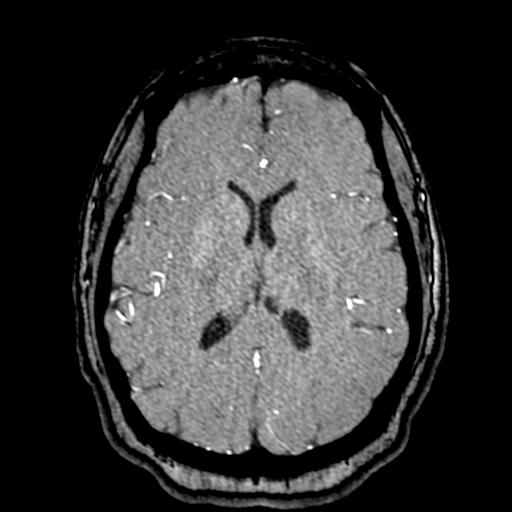
[im 196/205]
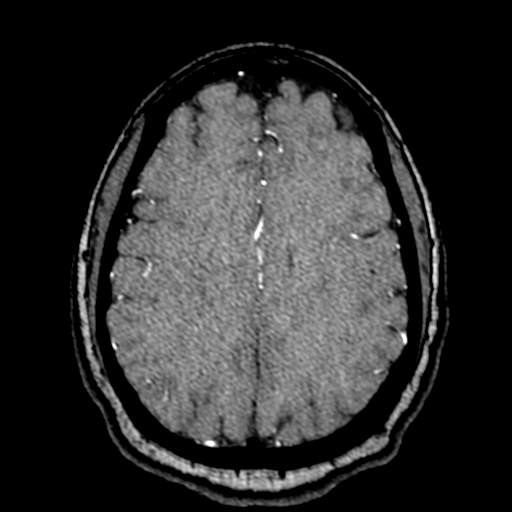

[17 of 48 positions shown; findings below may reference images not displayed]

FINDINGS: MRI HEAD FINDINGS

Brain: Cerebral volume within normal limits for age. Few scattered
nonspecific subcentimeter T2/FLAIR hyperintensities noted involving
the periventricular deep white matter both cerebral hemispheres,
nonspecific, but felt to be within normal limits for age and of
doubtful significance. No abnormal foci of restricted diffusion to
suggest acute or subacute ischemia. Gray-white matter
differentiation well maintained. No encephalomalacia to suggest
chronic cortical infarction. No foci of susceptibility artifact to
suggest acute or chronic intracranial hemorrhage.

No mass lesion, midline shift or mass effect. No hydrocephalus. No
extra-axial fluid collection. Pituitary gland and suprasellar region
normal. Midline structures intact and normal. No abnormal
enhancement within the brain.

Thin section imaging through the internal auditory canals was
performed. Seventh and eighth cranial nerves are seen coursing
normally through the cerebellopontine angle cisterns into the
internal auditory canals. No CPA angle mass. No intracanalicular
mass or abnormal enhancement. Inner ear structures including the
vestibulae, cochlea, and semi circular canals are normal. Normal
post ganglionic enhancement seen within the seventh cranial nerves
bilaterally. No mastoid effusion.

Vascular: Major intracranial vascular flow voids well maintained.

Skull and upper cervical spine: Craniocervical junction within
normal limits. Upper cervical spine normal. Bone marrow signal
intensity within normal limits. No scalp soft tissue abnormality.

Sinuses/Orbits: Globes and orbital soft tissues within normal
limits. Mild scattered mucosal thickening within the ethmoidal air
cells and sphenoid sinuses. Paranasal sinuses are otherwise clear.

Other: None.

MRA HEAD FINDINGS

ANTERIOR CIRCULATION:

Examination mildly degraded by motion artifact.

Distal cervical segments of the internal carotid arteries are widely
patent with symmetric antegrade flow. Petrous segments widely patent
bilaterally. Mild scattered atheromatous irregularity within the
cavernous/supraclinoid ICAs without hemodynamically significant
stenosis. ICA termini well perfused bilaterally. A1 segments widely
patent bilaterally. Normal anterior communicating artery. Mild
irregularity within the anterior cerebral arteries bilaterally
without hemodynamically significant stenosis.

M1 segments widely patent bilaterally. Normal MCA bifurcations.
Short-segment severe stenosis at the proximal right anterior
temporal branch (series 8949, image 15). No other high-grade
proximal M2 stenosis. Mild atheromatous irregularity within the
distal MCA branches bilaterally which are well perfused and fairly
symmetric.

POSTERIOR CIRCULATION:

Vertebral arteries patent to the vertebrobasilar junction without
stenosis. Right vertebral artery slightly dominant. Patent left
PICA. Right PICA not seen. Dominant right eye calf. Short-segment
mild stenosis noted within the mid basilar artery (series 6553,
image 9). Basilar otherwise widely patent to its distal aspect.
Superior cerebellar arteries patent bilaterally. Right PCA supplied
via the basilar. Left PCA supplied via the left P1 as well as a
prominent left posterior communicating artery. There is a 3 mm focal
outpouching extending superiorly from the proximal left P1 segment,
suspicious for a possible small aneurysm (series 8567, image 14).
PCAs widely patent to their distal aspects without stenosis. Mild
distal small vessel atheromatous irregularity.
IMPRESSION: MRI HEAD IMPRESSION:

Normal IAC protocol MRI of the brain. No structural findings to
explain patient's symptoms identified.

MRA HEAD IMPRESSION:

1. 3 mm focal outpouching extending from the proximal left P1
segment, suspicious for a small aneurysm.
2. Short-segment mild stenosis involving the mid basilar artery.
3. Short-segment severe stenosis involving the proximal right
anterior temporal artery branch.
4. Mild diffuse small vessel atheromatous irregularity throughout
the anterior and posterior circulation.

## 2022-05-14 ENCOUNTER — Telehealth: Payer: Self-pay | Admitting: Cardiovascular Disease

## 2022-05-14 NOTE — Telephone Encounter (Signed)
3 attempts to schedule fu appt from recall list.   Deleting recall.   

## 2024-03-20 ENCOUNTER — Encounter: Payer: Self-pay | Admitting: Emergency Medicine

## 2024-03-20 ENCOUNTER — Emergency Department
Admission: EM | Admit: 2024-03-20 | Discharge: 2024-03-21 | Disposition: A | Discharge status: Home / Self Care | Attending: Emergency Medicine | Admitting: Emergency Medicine

## 2024-03-20 ENCOUNTER — Other Ambulatory Visit: Payer: Self-pay

## 2024-03-20 DIAGNOSIS — I10 Essential (primary) hypertension: Secondary | ICD-10-CM | POA: Insufficient documentation

## 2024-03-20 DIAGNOSIS — R002 Palpitations: Secondary | ICD-10-CM | POA: Diagnosis not present

## 2024-03-20 DIAGNOSIS — R030 Elevated blood-pressure reading, without diagnosis of hypertension: Secondary | ICD-10-CM | POA: Diagnosis present

## 2024-03-20 NOTE — ED Triage Notes (Signed)
 Pt presents to the ED via POV with complaints of HTN. Pt states that her BP was elevated 150s/80s PTA - endorses compliance with medications except for today. She notes taking Metoprolol today, however forgot to take Amlodipione. A&Ox4 at this time. Denies dizziness, vision changes, numbness, weakness,  CP or SOB.

## 2024-03-20 NOTE — ED Provider Notes (Signed)
 [facility] Provider Note    Event Date/Time   First MD Initiated Contact with Patient 03/20/24 2303     (approximate)   History   Hypertension   HPI Cindy Payne is a 48 y.o. female who has a history of high blood pressure and paroxysmal atrial fibrillation who takes metoprolol and amlodipine.  She presents after feeling some palpitations tonight and not feeling exactly right and her blood pressure being high..    The symptoms have completely resolved.  She realized that she normally takes her metoprolol and amlodipine in the morning and this evening she started feel some palpitations and felt a little bit "off".  Her blood pressure was in the 150s/80-90s.  She took her metoprolol but did not take her amlodipine.  She decided come to the emergency department to get checked out.  She has had no pain.  No shortness of breath.  No fever.  No nausea, vomiting, visual changes, abdominal pain, nor dysuria.       Physical Exam   Triage Vital Signs: ED Triage Vitals  Encounter Vitals Group     BP 03/20/24 2255 (!) 152/91     Systolic BP Percentile --      Diastolic BP Percentile --      Pulse Rate 03/20/24 2255 83     Resp 03/20/24 2255 18     Temp 03/20/24 2255 98 F (36.7 C)     Temp src --      SpO2 03/20/24 2255 100 %     Weight 03/20/24 2257 111.1 kg (245 lb)     Height 03/20/24 2257 1.6 m (5\' 3" )     Head Circumference --      Peak Flow --      Pain Score 03/20/24 2255 0     Pain Loc --      Pain Education --      Exclude from Growth Chart --     Most recent vital signs: Vitals:   03/20/24 2255 03/20/24 2330  BP: (!) 152/91 127/70  Pulse: 83 82  Resp: 18 13  Temp: 98 F (36.7 C)   SpO2: 100% 100%    General: Awake, no distress.  Well-appearing. CV:  Good peripheral perfusion.  Regular rate and rhythm, normal heart sounds. Resp:  Normal effort. Speaking easily and comfortably, no accessory muscle usage nor intercostal  retractions.  Lungs are clear to auscultation bilaterally. Abd:  No distention.    ED Results / Procedures / Treatments   Labs (all labs ordered are listed, but only abnormal results are displayed) Labs Reviewed - No data to display   EKG  ED ECG REPORT I, Lynnda Sas, the attending physician, personally viewed and interpreted this ECG.  Date: 03/21/2024 EKG Time: 00: 26 Rate: 71 Rhythm: normal sinus rhythm sinus arrhythmia QRS Axis: normal Intervals: normal ST/T Wave abnormalities: normal Narrative Interpretation: no evidence of acute ischemia     PROCEDURES:  Critical Care performed: No  Procedures    IMPRESSION / MDM / ASSESSMENT AND PLAN / ED COURSE  I reviewed the triage vital signs and the nursing notes.                              Differential diagnosis includes, but is not limited to, side effects due to medication nonadherence, medication or drug side effect, electrolyte or metabolic abnormality, much less likely ACS.  Patient's presentation is most  consistent with acute, uncomplicated illness.  Labs/studies ordered: EKG  Interventions/Medications given:  Medications - No data to display  (Note:  hospital course my include additional interventions and/or labs/studies not listed above.)   Patient is asymptomatic with minimal symptoms.  Low risk for ACS, PERC negative.  No infectious signs or symptoms.  I offered to obtain labs but explained that I did not think that they would be particularly beneficial.  There is no evidence that she is having a hypertensive emergency her blood pressure is actually quite good now, initially it was about 150 systolic but it came down to 127/70 without intervention.  I recommend she continue her regular medication course and follow-up with her regular doctor.  She is fine with that and does not feel it is necessary to obtain lab work either.  I gave my usual return precautions; her medical screening exam is reassuring  with no evidence of an emergent medical condition at this time.         FINAL CLINICAL IMPRESSION(S) / ED DIAGNOSES   Final diagnoses:  Palpitations  Hypertension, unspecified type     Rx / DC Orders   ED Discharge Orders     None        Note:  This document was prepared using Dragon voice recognition software and may include unintentional dictation errors.   Lynnda Sas, MD 03/21/24 [phone]

## 2024-03-21 NOTE — Discharge Instructions (Signed)
 Your evaluation in the Emergency Department today was reassuring.  We did not find any specific abnormalities.  We recommend you drink plenty of fluids, take your regular medications and/or any new ones prescribed today, and follow up with the doctor(s) listed in these documents as recommended.  Return to the Emergency Department if you develop new or worsening symptoms that concern you.

## 2024-04-19 ENCOUNTER — Other Ambulatory Visit: Payer: Self-pay | Admitting: Family Medicine

## 2024-04-19 DIAGNOSIS — Z1231 Encounter for screening mammogram for malignant neoplasm of breast: Secondary | ICD-10-CM

## 2024-06-04 ENCOUNTER — Ambulatory Visit: Admitting: Cardiovascular Disease

## 2024-07-28 NOTE — Progress Notes (Unsigned)
 Cardiology Office Note  Date:  07/30/2024   ID:  Cindy, Payne 1976-03-05, MRN [id number]  PCP:  Buren Rock HERO, MD   Chief Complaint  Patient presents with   New Patient (Initial Visit)    Ref by Rock Buren, MD for follow up from [facility] ER 03/20/2024 palpitations. Patient c/o chest pain/indigestion at times. Denies any symptoms of palpitations since the ER visit.     HPI:  Cindy Payne a 48 y.o. femalewith past medical history of: PAF  hypertension,  bowel obstruction,  Diaphragmatic hernia repair morbid obesity status post gastric bypass surgery  Syncope 01/2017, atrial fib,  who presents by referral from Dr. Rock Buren for consultation of her palpitations   LOV 3/21 In general reports she has been doing well Reports doing a  step challenge, with her friend, walks 1 mile or more on a regular basis Denies significant shortness of breath or chest discomfort on exertion  Recent events in April 2025 Was sitting on her bed at home, felt off, unclear etiology Chest felt weird, possible palpitations Went to mothers house next-door, walked over to get checked out,  BP was ok, did not appreciate elevated heart rate Took extra half dose metoprolol  Went to the emergency room March 21, 2024  EKG showing normal sinus rhythm Workup normal and was discharged home  She has been taking metoprolol  tartrate 50 mg in the morning, none in the evening No dizziness/near syncope  No chest pain or shortness of breath on exertion as detailed above Denies any lower extremity edema   EKG personally reviewed by myself on todays visit EKG Interpretation Date/Time:  Friday July 30 2024 10:16:29 EDT Ventricular Rate:  70 PR Interval:  170 QRS Duration:  86 QT Interval:  406 QTC Calculation: 438 R Axis:   25  Text Interpretation: Normal sinus rhythm Normal ECG When compared with ECG of 21-Mar-2024 00:26, No significant change was found Confirmed by Perla Lye  [phone]) on 07/30/2024 10:21:18 AM     Prior records reviewed Episode of atrial fibrillation February 2018, lone episode Presented after drinking, was at a restaurant, syncope, rhythm converted to normal rhythm stayed overnight in the hospital Echocardiogram normal at that time  Follow-up 30-day event monitor with no atrial fibrillation started on anticoagulation presumably for history of chads vasc2 Currently not on anticoagulation   near syncope August 2019, improved with IV fluids   normal echocardiogram November 2019, 2-week monitor showing normal sinus rhythm no significant arrhythmia, rare ectopy    MRI brain showing mild athero-, very small aneurysm    admitted to [facility] with a large diaphragmatic hernia,open resection of small bowel segment secondary to obstruction      Postoperative leukocytosis, shortness of breath, and left-sided pleural effusion.     Echocardiogram  showed an EF of 65 to 70%, normal RV size and systolic function, normal bilateral atrial size,    left-sided thoracentesis.    Carotid artery ultrasound showed less than 50% bilateral ICA stenosis.   cardiac monitoring showed a baseline sinus rhythm  No significant arrhythmia was noted.   Details of prior near-syncope  ED notes indicated the patient was outside talking to someone and standing for approximately 15 minutes when she be began to feel warm and hearing an echo sound in her ears followed by near syncope.  She was able to get down to the ground without loss of consciousness.  Upon attempting to stand back up she again felt dizzy and had associated near  syncope.    episode of tachypalpitations with associated dizziness and presyncope on 10/22/2018 while walking through the dollar store.   lasted for several minutes.     PMH:   has a past medical history of Hypertension, Morbid obesity (HCC), and PAF (paroxysmal atrial fibrillation) (HCC).  PSH:    Past Surgical History:  Procedure Laterality Date    CHOLECYSTECTOMY     GASTRIC BYPASS     TUBAL LIGATION      Current Outpatient Medications  Medication Sig Dispense Refill   amLODipine  (NORVASC ) 5 MG tablet Take 5 mg by mouth daily.     cetirizine (ZYRTEC) 10 MG tablet Take 10 mg by mouth daily.     cholecalciferol (VITAMIN D3) 25 MCG (1000 UNIT) tablet Take 1,000 Units by mouth daily.     Cyanocobalamin (B-12) 500 MCG TABS Take 500 mcg by mouth daily.     metoprolol  tartrate (LOPRESSOR ) 50 MG tablet Take 50 mg by mouth daily.     Multiple Vitamin (MULTIVITAMIN) tablet Take 1 tablet by mouth daily.     SUMAtriptan (IMITREX) 50 MG tablet Take 50 mg by mouth every 2 (two) hours as needed.     WEGOVY 1 MG/0.5ML SOAJ SQ injection Inject 1 mg into the skin once a week.     No current facility-administered medications for this visit.     Allergies:   Sulfamethoxazole-trimethoprim   Social History:  The patient  reports that she has never smoked. She has never used smokeless tobacco. She reports current alcohol use. She reports that she does not use drugs.   Family History:   family history includes Arrhythmia in her mother; Atrial fibrillation in her mother; Congestive Heart Failure in her mother; Dementia in her maternal grandmother; Hypertension in her mother and sister.    Review of Systems: Review of Systems  Constitutional: Negative.   HENT: Negative.    Respiratory: Negative.    Cardiovascular: Negative.   Gastrointestinal: Negative.   Musculoskeletal: Negative.   Neurological: Negative.   Psychiatric/Behavioral: Negative.    All other systems reviewed and are negative.   PHYSICAL EXAM: VS:  BP 128/80 (BP Location: Right Arm, Patient Position: Sitting, Cuff Size: Large)   Pulse 70   Ht 5' 3 (1.6 m)   Wt 233 lb 8 oz (105.9 kg)   SpO2 96%   BMI 41.36 kg/m  , BMI Body mass index is 41.36 kg/m. GEN: Well nourished, well developed, in no acute distress HEENT: normal Neck: no JVD, carotid bruits, or masses Cardiac:  RRR; no murmurs, rubs, or gallops,no edema  Respiratory:  clear to auscultation bilaterally, normal work of breathing GI: soft, nontender, nondistended, + BS MS: no deformity or atrophy Skin: warm and dry, no rash Neuro:  Strength and sensation are intact Psych: euthymic mood, full affect  Recent Labs: No results found for requested labs within last 365 days.    Lipid Panel Lab Results  Component Value Date   CHOL 82 01/26/2017   HDL 39 (L) 01/26/2017   LDLCALC 35 01/26/2017   TRIG 42 01/26/2017      Wt Readings from Last 3 Encounters:  07/30/24 233 lb 8 oz (105.9 kg)  03/20/24 245 lb (111.1 kg)  03/06/20 210 lb 6 oz (95.4 kg)       ASSESSMENT AND PLAN:  Problem List Items Addressed This Visit       Cardiology Problems   New onset atrial fibrillation (HCC) - Primary   Relevant Medications   metoprolol   tartrate (LOPRESSOR ) 50 MG tablet   Other Relevant Orders   EKG 12-Lead (Completed)   Other Visit Diagnoses       Benign essential HTN       Relevant Medications   metoprolol  tartrate (LOPRESSOR ) 50 MG tablet   Other Relevant Orders   EKG 12-Lead (Completed)      Palpitations, history of atrial fibrillation No clear recent documentation of atrial fibrillation or arrhythmia On recent episode reported feeling  weird though details unclear Seen in the emergency room, EKG normal We have recommended close monitoring of heart rhythm using her Apple Watch -Suggested she call our office for concerns for arrhythmia, repeat Zio monitor could be ordered - Recommend she change metoprolol  tartrate 50 once a day to metoprolol  succinate 50 daily, suggested she take that tartrate as needed for arrhythmia concerning for A-fib  Essential hypertension Continue amlodipine  5 daily, start metoprolol  succinate 50 daily Take metoprolol  to tartrate as needed for palpitations  Obesity Prior history gastric bypass surgery Exercising, monitoring weight On [facility]  Ms. Favaro  was seen in consultation for Dr. Buren and will be referred back to her office for ongoing care of the issues detailed above  Signed, Velinda Lunger, M.D., Ph.D. [facility] [place], [place] [phone]

## 2024-07-30 ENCOUNTER — Ambulatory Visit: Attending: Cardiovascular Disease | Admitting: Cardiovascular Disease

## 2024-07-30 ENCOUNTER — Encounter: Payer: Self-pay | Admitting: Cardiovascular Disease

## 2024-07-30 VITALS — BP 128/80 | HR 70 | Ht 63.0 in | Wt 233.5 lb

## 2024-07-30 DIAGNOSIS — I10 Essential (primary) hypertension: Secondary | ICD-10-CM | POA: Diagnosis not present

## 2024-07-30 DIAGNOSIS — I4891 Unspecified atrial fibrillation: Secondary | ICD-10-CM

## 2024-07-30 MED ORDER — METOPROLOL SUCCINATE ER 50 MG PO TB24: 50.0000 mg | ORAL_TABLET | Freq: Every day | ORAL | 3 refills | Status: AC

## 2024-07-30 MED ORDER — METOPROLOL TARTRATE 50 MG PO TABS: 50.0000 mg | ORAL_TABLET | Freq: Every day | ORAL | Status: AC | PRN

## 2024-07-30 NOTE — Patient Instructions (Addendum)
 Medication Instructions:   Please take metoprolol  succinate 50 mg daily  Take metoprolol  tartrate 50 mg daily as needed for afib spells/palpitations  If you need a refill on your cardiac medications before your next appointment, please call your pharmacy.   Lab work: No new labs needed  Testing/Procedures: No new testing needed  Follow-Up: At [facility], you and your health needs are our priority.  As part of our continuing mission to provide you with exceptional heart care, we have created designated Provider Care Teams.  These Care Teams include your primary Cardiologist (physician) and Advanced Practice Providers (APPs -  Physician Assistants and Nurse Practitioners) who all work together to provide you with the care you need, when you need it.  You will need a follow up appointment as needed  Providers on your designated Care Team:   Lonni Meager, NP Bernardino Bring, PA-C Cadence [place], [place]  COVID-19 Vaccine Information can be found at: [email or web address] For questions related to vaccine distribution or appointments, please email [email or web address] or call [phone].

## 2024-08-20 ENCOUNTER — Ambulatory Visit
Admission: RE | Admit: 2024-08-20 | Discharge: 2024-08-20 | Disposition: A | Discharge status: Home / Self Care | Source: Ambulatory Visit | Attending: Family Medicine | Admitting: Family Medicine

## 2024-08-20 DIAGNOSIS — Z1231 Encounter for screening mammogram for malignant neoplasm of breast: Secondary | ICD-10-CM | POA: Diagnosis present

## 2024-08-25 ENCOUNTER — Encounter: Payer: Self-pay | Admitting: Family Medicine

## 2024-08-30 ENCOUNTER — Other Ambulatory Visit: Payer: Self-pay | Admitting: Family Medicine

## 2024-08-30 DIAGNOSIS — R928 Other abnormal and inconclusive findings on diagnostic imaging of breast: Secondary | ICD-10-CM

## 2024-08-30 DIAGNOSIS — N63 Unspecified lump in unspecified breast: Secondary | ICD-10-CM

## 2024-08-30 DIAGNOSIS — N6489 Other specified disorders of breast: Secondary | ICD-10-CM

## 2024-08-31 ENCOUNTER — Ambulatory Visit
Admission: RE | Admit: 2024-08-31 | Discharge: 2024-08-31 | Disposition: A | Discharge status: Home / Self Care | Source: Ambulatory Visit | Attending: Family Medicine | Admitting: Family Medicine

## 2024-08-31 DIAGNOSIS — R928 Other abnormal and inconclusive findings on diagnostic imaging of breast: Secondary | ICD-10-CM | POA: Diagnosis present

## 2024-08-31 DIAGNOSIS — N6489 Other specified disorders of breast: Secondary | ICD-10-CM | POA: Insufficient documentation

## 2024-08-31 DIAGNOSIS — N63 Unspecified lump in unspecified breast: Secondary | ICD-10-CM

## 2024-09-01 ENCOUNTER — Other Ambulatory Visit: Payer: Self-pay | Admitting: Family Medicine

## 2024-09-01 DIAGNOSIS — R928 Other abnormal and inconclusive findings on diagnostic imaging of breast: Secondary | ICD-10-CM

## 2024-09-03 ENCOUNTER — Encounter
Admission: RE | Disposition: A | Discharge status: Home / Self Care | Payer: Self-pay | Source: Home / Self Care | Attending: Gastroenterology

## 2024-09-03 ENCOUNTER — Ambulatory Visit: Admitting: Certified Registered"

## 2024-09-03 ENCOUNTER — Other Ambulatory Visit: Payer: Self-pay

## 2024-09-03 ENCOUNTER — Ambulatory Visit
Admission: RE | Admit: 2024-09-03 | Discharge: 2024-09-03 | Disposition: A | Discharge status: Home / Self Care | Attending: Gastroenterology | Admitting: Gastroenterology

## 2024-09-03 ENCOUNTER — Encounter: Payer: Self-pay | Admitting: Gastroenterology

## 2024-09-03 DIAGNOSIS — Z6839 Body mass index (BMI) 39.0-39.9, adult: Secondary | ICD-10-CM | POA: Diagnosis not present

## 2024-09-03 DIAGNOSIS — K219 Gastro-esophageal reflux disease without esophagitis: Secondary | ICD-10-CM | POA: Insufficient documentation

## 2024-09-03 DIAGNOSIS — Z9884 Bariatric surgery status: Secondary | ICD-10-CM | POA: Diagnosis not present

## 2024-09-03 DIAGNOSIS — E66813 Obesity, class 3: Secondary | ICD-10-CM | POA: Insufficient documentation

## 2024-09-03 DIAGNOSIS — Z1211 Encounter for screening for malignant neoplasm of colon: Secondary | ICD-10-CM | POA: Insufficient documentation

## 2024-09-03 DIAGNOSIS — I10 Essential (primary) hypertension: Secondary | ICD-10-CM | POA: Diagnosis not present

## 2024-09-03 DIAGNOSIS — K648 Other hemorrhoids: Secondary | ICD-10-CM | POA: Insufficient documentation

## 2024-09-03 DIAGNOSIS — Z980 Intestinal bypass and anastomosis status: Secondary | ICD-10-CM | POA: Diagnosis not present

## 2024-09-03 DIAGNOSIS — I480 Paroxysmal atrial fibrillation: Secondary | ICD-10-CM | POA: Diagnosis not present

## 2024-09-03 DIAGNOSIS — R195 Other fecal abnormalities: Secondary | ICD-10-CM | POA: Insufficient documentation

## 2024-09-03 HISTORY — PX: COLONOSCOPY: SHX5424

## 2024-09-03 HISTORY — PX: ESOPHAGOGASTRODUODENOSCOPY: SHX5428

## 2024-09-03 LAB — POCT PREGNANCY, URINE: Preg Test, Ur: NEGATIVE

## 2024-09-03 SURGERY — COLONOSCOPY
Anesthesia: General

## 2024-09-03 MED ORDER — LIDOCAINE HCL (CARDIAC) PF 100 MG/5ML IV SOSY
PREFILLED_SYRINGE | INTRAVENOUS | Status: DC | PRN
  Administered 2024-09-03: 50 mg via INTRAVENOUS

## 2024-09-03 MED ORDER — PROPOFOL 10 MG/ML IV BOLUS
INTRAVENOUS | Status: DC | PRN
  Administered 2024-09-03: 20 mg via INTRAVENOUS
  Administered 2024-09-03: 100 mg via INTRAVENOUS

## 2024-09-03 MED ORDER — SODIUM CHLORIDE 0.9 % IV SOLN: INTRAVENOUS | Status: DC

## 2024-09-03 MED ORDER — PROPOFOL 500 MG/50ML IV EMUL
INTRAVENOUS | Status: DC | PRN
  Administered 2024-09-03: 200 ug/kg/min via INTRAVENOUS

## 2024-09-03 NOTE — Anesthesia Preprocedure Evaluation (Addendum)
 Anesthesia Evaluation  Patient identified by MRN, date of birth, ID band Patient awake    Reviewed: Allergy & Precautions, NPO status , Patient's Chart, lab work & pertinent test results  Airway Mallampati: II  TM Distance: >3 FB Neck ROM: full    Dental  (+) Teeth Intact   Pulmonary neg pulmonary ROS   Pulmonary exam normal breath sounds clear to auscultation       Cardiovascular Exercise Tolerance: Good hypertension, Pt. on medications + dysrhythmias Atrial Fibrillation  Rhythm:Regular Rate:Normal     Neuro/Psych negative neurological ROS  negative psych ROS   GI/Hepatic negative GI ROS, Neg liver ROS,,,  Endo/Other  negative endocrine ROS  Class 3 obesity  Renal/GU negative Renal ROS  negative genitourinary   Musculoskeletal   Abdominal  (+) + obese  Peds negative pediatric ROS (+)  Hematology negative hematology ROS (+)   Anesthesia Other Findings Past Medical History: No date: Hypertension No date: Morbid obesity (HCC)     Comment:  a. status post gastric bypass No date: PAF (paroxysmal atrial fibrillation) (HCC)     Comment:  a.  Diagnosed 2/18; b. CHADS2VASc 2 (HTN, female)  Past Surgical History: No date: CHOLECYSTECTOMY No date: GASTRIC BYPASS No date: TUBAL LIGATION  BMI    Body Mass Index: 39.86 kg/m      Reproductive/Obstetrics negative OB ROS                              Anesthesia Physical Anesthesia Plan  ASA: 2  Anesthesia Plan: General   Post-op Pain Management:    Induction: Intravenous  PONV Risk Score and Plan: Propofol  infusion and TIVA  Airway Management Planned: Natural Airway and Nasal Cannula  Additional Equipment:   Intra-op Plan:   Post-operative Plan:   Informed Consent: I have reviewed the patients History and Physical, chart, labs and discussed the procedure including the risks, benefits and alternatives for the proposed  anesthesia with the patient or authorized representative who has indicated his/her understanding and acceptance.     Dental Advisory Given  Plan Discussed with: CRNA  Anesthesia Plan Comments:          Anesthesia Quick Evaluation

## 2024-09-03 NOTE — Op Note (Signed)
 [facility] Gastroenterology Patient Name: Cindy Payne Procedure Date: 09/03/2024 8:13 AM MRN: [id number] Account #: [id number] Date of Birth: 1976/09/29 Admit Type: Outpatient Age: 48 Room: [facility] ENDO ROOM 2 Gender: Female Note Status: Finalized Instrument Name: Colon Scope [id number] Procedure:             Colonoscopy Indications:           Screening for colorectal malignant neoplasm due to                         positive Cologuard test Providers:             Ruel Kung MD, MD Referring MD:          Rock EMERSON Pounds, MD (Referring MD) Medicines:             Monitored Anesthesia Care Complications:         No immediate complications. Procedure:             Pre-Anesthesia Assessment:                        - Prior to the procedure, a History and Physical was                         performed, and patient medications, allergies and                         sensitivities were reviewed. The patient's tolerance                         of previous anesthesia was reviewed.                        - The risks and benefits of the procedure and the                         sedation options and risks were discussed with the                         patient. All questions were answered and informed                         consent was obtained.                        - ASA Grade Assessment: II - A patient with mild                         systemic disease.                        After obtaining informed consent, the colonoscope was                         passed under direct vision. Throughout the procedure,                         the patient's blood pressure, pulse, and oxygen                         saturations  were monitored continuously. The                         Colonoscope was introduced through the anus and                         advanced to the the cecum, identified by the                         appendiceal orifice. The colonoscopy was performed                          with ease. The patient tolerated the procedure well.                         The quality of the bowel preparation was excellent.                         The ileocecal valve, appendiceal orifice, and rectum                         were photographed. Findings:      The perianal and digital rectal examinations were normal.      Internal hemorrhoids were found during retroflexion. The hemorrhoids       were moderate and medium-sized.      The exam was otherwise without abnormality on direct and retroflexion       views. Impression:            - Internal hemorrhoids.                        - The examination was otherwise normal on direct and                         retroflexion views.                        - No specimens collected. Recommendation:        - Discharge patient to home (with escort).                        - Resume previous diet.                        - Continue present medications.                        - Repeat colonoscopy in 10 years for screening                         purposes. Procedure Code(s):     --- Professional ---                        [id number], Colonoscopy, flexible; diagnostic, including                         collection of specimen(s) by brushing or washing, when                         performed (separate procedure) Diagnosis  Code(s):     --- Professional ---                        Z12.11, Encounter for screening for malignant neoplasm                         of colon                        R19.5, Other fecal abnormalities                        K64.8, Other hemorrhoids CPT copyright 2022 American [employer]. All rights reserved. The codes documented in this report are preliminary and upon coder review may  be revised to meet current compliance requirements. Ruel Kung, MD Ruel Kung MD, MD 09/03/2024 8:36:10 AM This report has been signed electronically. Number of Addenda: 0 Note Initiated On: 09/03/2024 8:13 AM Scope Withdrawal Time: 0  hours 6 minutes 17 seconds  Total Procedure Duration: 0 hours 10 minutes 57 seconds  Estimated Blood Loss:  Estimated blood loss: none.      [facility]

## 2024-09-03 NOTE — Transfer of Care (Signed)
 Immediate Anesthesia Transfer of Care Note  Patient: Cindy Payne  Procedure(s) Performed: COLONOSCOPY EGD (ESOPHAGOGASTRODUODENOSCOPY)  Patient Location: Endoscopy Unit  Anesthesia Type:General  Level of Consciousness: drowsy and patient cooperative  Airway & Oxygen Therapy: Patient Spontanous Breathing and Patient connected to face mask oxygen  Post-op Assessment: Report given to RN, Post -op Vital signs reviewed and stable, and Patient moving all extremities X 4  Post vital signs: Reviewed and stable  Last Vitals:  Vitals Value Taken Time  BP 107/77 09/03/24 08:36  Temp    Pulse 85 09/03/24 08:36  Resp 10 09/03/24 08:36  SpO2 100 % 09/03/24 08:36  Vitals shown include unfiled device data.  Last Pain:  Vitals:   09/03/24 0759  TempSrc: Temporal  PainSc: 0-No pain         Complications: No notable events documented.

## 2024-09-03 NOTE — Op Note (Signed)
 [facility] Gastroenterology Patient Name: Cindy Payne Procedure Date: 09/03/2024 8:14 AM MRN: [id number] Account #: [id number] Date of Birth: 1976-10-24 Admit Type: Outpatient Age: 48 Room: [facility] ENDO ROOM 2 Gender: Female Note Status: Finalized Instrument Name: Barnie GI Scope [id number] Procedure:             Upper GI endoscopy Indications:           Follow-up of gastro-esophageal reflux disease Providers:             Ruel Kung MD, MD Referring MD:          Rock EMERSON Pounds, MD (Referring MD) Medicines:             Monitored Anesthesia Care Complications:         No immediate complications. Procedure:             Pre-Anesthesia Assessment:                        - Prior to the procedure, a History and Physical was                         performed, and patient medications, allergies and                         sensitivities were reviewed. The patient's tolerance                         of previous anesthesia was reviewed.                        - The risks and benefits of the procedure and the                         sedation options and risks were discussed with the                         patient. All questions were answered and informed                         consent was obtained.                        - ASA Grade Assessment: II - A patient with mild                         systemic disease.                        After obtaining informed consent, the endoscope was                         passed under direct vision. Throughout the procedure,                         the patient's blood pressure, pulse, and oxygen                         saturations were monitored continuously. The Endoscope  was introduced through the mouth, and advanced to the                         jejunum. The upper GI endoscopy was accomplished with                         ease. The patient tolerated the procedure well. Findings:      The examined jejunum was  normal.      The esophagus was normal.      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       This was characterized by healthy appearing mucosa.      The cardia and gastric fundus were normal on retroflexion. Impression:            - Normal examined jejunum.                        - Normal esophagus.                        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                        - No specimens collected. Recommendation:        - Perform a colonoscopy today. Procedure Code(s):     --- Professional ---                        [phone], Esophagogastroduodenoscopy, flexible,                         transoral; diagnostic, including collection of                         specimen(s) by brushing or washing, when performed                         (separate procedure) Diagnosis Code(s):     --- Professional ---                        Z98.0, Intestinal bypass and anastomosis status                        K21.9, Gastro-esophageal reflux disease without                         esophagitis CPT copyright 2022 American [employer] Association. All rights reserved. The codes documented in this report are preliminary and upon coder review may  be revised to meet current compliance requirements. Ruel Kung, MD Ruel Kung MD, MD 09/03/2024 8:20:51 AM This report has been signed electronically. Number of Addenda: 0 Note Initiated On: 09/03/2024 8:14 AM Estimated Blood Loss:  Estimated blood loss: none.      [facility]

## 2024-09-03 NOTE — Anesthesia Postprocedure Evaluation (Signed)
 Anesthesia Post Note  Patient: Cindy Payne  Procedure(s) Performed: COLONOSCOPY EGD (ESOPHAGOGASTRODUODENOSCOPY)  Patient location during evaluation: PACU Anesthesia Type: General Level of consciousness: awake Pain management: satisfactory to patient Vital Signs Assessment: post-procedure vital signs reviewed and stable Respiratory status: spontaneous breathing Cardiovascular status: stable Anesthetic complications: no   No notable events documented.   Last Vitals:  Vitals:   09/03/24 0841 09/03/24 0850  BP: 107/77 102/68  Pulse: 100 100  Resp: (!) 31 (!) 21  Temp: (!) 35.1 C   SpO2: 100%     Last Pain:  Vitals:   09/03/24 0841  TempSrc: Oral  PainSc:                  VAN STAVEREN,Lamar Meter

## 2024-09-03 NOTE — H&P (Signed)
 Ruel Kung , MD [address], [address], [place], [place], [address] Phone: [phone] Fax: [phone]  Primary Care Physician:  Buren Rock HERO, MD   Pre-Procedure History & Physical: HPI:  Cindy Payne is a 48 y.o. female is here for an endoscopy and colonoscopy    Past Medical History:  Diagnosis Date   Hypertension    Morbid obesity (HCC)    a. status post gastric bypass   PAF (paroxysmal atrial fibrillation) (HCC)    a.  Diagnosed 2/18; b. CHADS2VASc 2 (HTN, female)    Past Surgical History:  Procedure Laterality Date   CHOLECYSTECTOMY     GASTRIC BYPASS     TUBAL LIGATION      Prior to Admission medications   Medication Sig Start Date End Date Taking? Authorizing Provider  amLODipine  (NORVASC ) 5 MG tablet Take 5 mg by mouth daily.    [provider]  cetirizine (ZYRTEC) 10 MG tablet Take 10 mg by mouth daily. 02/04/20   [provider]  cholecalciferol (VITAMIN D3) 25 MCG (1000 UNIT) tablet Take 1,000 Units by mouth daily.    [provider]  Cyanocobalamin (B-12) 500 MCG TABS Take 500 mcg by mouth daily.    [provider]  metoprolol  succinate (TOPROL -XL) 50 MG 24 hr tablet Take 1 tablet (50 mg total) by mouth daily. Take with or immediately following a meal. 07/30/24 10/28/24  Gollan, Timothy J, MD  metoprolol  tartrate (LOPRESSOR ) 50 MG tablet Take 1 tablet (50 mg total) by mouth daily as needed. 07/30/24   Gollan, Timothy J, MD  Multiple Vitamin (MULTIVITAMIN) tablet Take 1 tablet by mouth daily.    [provider]  SUMAtriptan (IMITREX) 50 MG tablet Take 50 mg by mouth every 2 (two) hours as needed.    [provider]  WEGOVY 1 MG/0.5ML SOAJ SQ injection Inject 1 mg into the skin once a week. 07/08/24   [provider]    Allergies as of 08/10/2024 - Review Complete 07/30/2024  Allergen Reaction Noted   Sulfamethoxazole-trimethoprim Rash and Other (See Comments) 08/09/2015    Family  History  Problem Relation Age of Onset   Arrhythmia Mother    Congestive Heart Failure Mother    Hypertension Mother    Atrial fibrillation Mother    Hypertension Sister    Dementia Maternal Grandmother    Breast cancer Neg Hx     Social History   Socioeconomic History   Marital status: Single    Spouse name: Not on file   Number of children: Not on file   Years of education: Not on file   Highest education level: Not on file  Occupational History   Not on file  Tobacco Use   Smoking status: Never   Smokeless tobacco: Never  Vaping Use   Vaping status: Never Used  Substance and Sexual Activity   Alcohol use: Yes    Comment: social   Drug use: No   Sexual activity: Not on file  Other Topics Concern   Not on file  Social History Narrative   Not on file   Social Drivers of Health   Financial Resource Strain: Low Risk  (08/02/2024)   Received from [facility]   Overall Financial Resource Strain (CARDIA)    Difficulty of Paying Living Expenses: Not very hard  Food Insecurity: No Food Insecurity (08/02/2024)   Received from [facility]   Hunger Vital Sign    Within the past  12 months, you worried that your food would run out before you got the money to buy more.: Never true    Within the past 12 months, the food you bought just didn't last and you didn't have money to get more.: Never true  Transportation Needs: No Transportation Needs (08/02/2024)   Received from [facility] - Transportation    In the past 12 months, has lack of transportation kept you from medical appointments or from getting medications?: No    Lack of Transportation (Non-Medical): No  Physical Activity: Not on file  Stress: Not on file  Social Connections: Not on file  Intimate Partner Violence: Not on file    Review of Systems: See HPI, otherwise negative ROS  Physical Exam: LMP 08/18/2024  General:   Alert,  pleasant and  cooperative in NAD Head:  Normocephalic and atraumatic. Neck:  Supple; no masses or thyromegaly. Lungs:  Clear throughout to auscultation, normal respiratory effort.    Heart:  +S1, +S2, Regular rate and rhythm, No edema. Abdomen:  Soft, nontender and nondistended. Normal bowel sounds, without guarding, and without rebound.   Neurologic:  Alert and  oriented x4;  grossly normal neurologically.  Impression/Plan: Cindy Payne is here for an endoscopy and colonoscopy  to be performed for  evaluation of GERD and positive cologuard    Risks, benefits, limitations, and alternatives regarding endoscopy have been reviewed with the patient.  Questions have been answered.  All parties agreeable.   Ruel Kung, MD  09/03/2024, 7:42 AM

## 2024-09-06 ENCOUNTER — Ambulatory Visit
Admission: RE | Admit: 2024-09-06 | Discharge: 2024-09-06 | Disposition: A | Discharge status: Home / Self Care | Source: Ambulatory Visit | Attending: Family Medicine | Admitting: Family Medicine

## 2024-09-06 DIAGNOSIS — N6011 Diffuse cystic mastopathy of right breast: Secondary | ICD-10-CM | POA: Insufficient documentation

## 2024-09-06 DIAGNOSIS — R928 Other abnormal and inconclusive findings on diagnostic imaging of breast: Secondary | ICD-10-CM | POA: Insufficient documentation

## 2024-09-06 HISTORY — PX: BREAST BIOPSY: SHX20

## 2024-09-06 MED ORDER — LIDOCAINE-EPINEPHRINE 1 %-1:100000 IJ SOLN
5.0000 mL | Freq: Once | INTRAMUSCULAR | Status: AC
  Administered 2024-09-06: 5 mL
  Filled 2024-09-06: qty 5

## 2024-09-06 MED ORDER — LIDOCAINE 1 % OPTIME INJ - NO CHARGE
2.0000 mL | Freq: Once | INTRAMUSCULAR | Status: AC
  Administered 2024-09-06: 2 mL
  Filled 2024-09-06: qty 2

## 2024-09-07 LAB — SURGICAL PATHOLOGY
